# Patient Record
Sex: Female | Born: 1945
Health system: Southern US, Community
[De-identification: ages and names within clinical notes are randomized; demographics above are authoritative.]

## PROBLEM LIST (undated history)

## (undated) DIAGNOSIS — J9600 Acute respiratory failure, unspecified whether with hypoxia or hypercapnia: Secondary | ICD-10-CM

## (undated) DIAGNOSIS — J302 Other seasonal allergic rhinitis: Secondary | ICD-10-CM

## (undated) DIAGNOSIS — I10 Essential (primary) hypertension: Secondary | ICD-10-CM

## (undated) DIAGNOSIS — I509 Heart failure, unspecified: Secondary | ICD-10-CM

## (undated) DIAGNOSIS — G629 Polyneuropathy, unspecified: Secondary | ICD-10-CM

## (undated) DIAGNOSIS — I83893 Varicose veins of bilateral lower extremities with other complications: Secondary | ICD-10-CM

## (undated) DIAGNOSIS — E039 Hypothyroidism, unspecified: Secondary | ICD-10-CM

## (undated) DIAGNOSIS — E079 Disorder of thyroid, unspecified: Secondary | ICD-10-CM

## (undated) HISTORY — DX: Morbid (severe) obesity due to excess calories: E66.01

## (undated) HISTORY — DX: Heart failure, unspecified: I50.9

## (undated) HISTORY — DX: Polyneuropathy, unspecified: G62.9

## (undated) HISTORY — DX: Other seasonal allergic rhinitis: J30.2

## (undated) HISTORY — DX: Acute respiratory failure, unspecified whether with hypoxia or hypercapnia: J96.00

## (undated) HISTORY — DX: Essential (primary) hypertension: I10

## (undated) HISTORY — DX: Hypothyroidism, unspecified: E03.9

## (undated) HISTORY — DX: Varicose veins of bilateral lower extremities with other complications: I83.893

---

## 2016-03-29 ENCOUNTER — Other Ambulatory Visit (HOSPITAL_BASED_OUTPATIENT_CLINIC_OR_DEPARTMENT_OTHER): Payer: Self-pay

## 2016-03-29 DIAGNOSIS — R0683 Snoring: Secondary | ICD-10-CM

## 2016-03-29 DIAGNOSIS — R5383 Other fatigue: Secondary | ICD-10-CM

## 2016-05-26 ENCOUNTER — Ambulatory Visit (HOSPITAL_BASED_OUTPATIENT_CLINIC_OR_DEPARTMENT_OTHER): Payer: Medicare HMO

## 2016-05-28 ENCOUNTER — Encounter (HOSPITAL_BASED_OUTPATIENT_CLINIC_OR_DEPARTMENT_OTHER): Payer: Medicare HMO

## 2016-07-14 ENCOUNTER — Ambulatory Visit (HOSPITAL_BASED_OUTPATIENT_CLINIC_OR_DEPARTMENT_OTHER): Payer: Medicare HMO | Attending: Internal Medicine | Admitting: Internal Medicine

## 2016-07-14 VITALS — Ht 62.0 in | Wt 214.0 lb

## 2016-07-14 DIAGNOSIS — G479 Sleep disorder, unspecified: Secondary | ICD-10-CM | POA: Diagnosis present

## 2016-07-14 DIAGNOSIS — R0683 Snoring: Secondary | ICD-10-CM | POA: Diagnosis not present

## 2016-07-14 DIAGNOSIS — R5383 Other fatigue: Secondary | ICD-10-CM

## 2016-07-14 DIAGNOSIS — Z87898 Personal history of other specified conditions: Secondary | ICD-10-CM

## 2016-07-14 DIAGNOSIS — R400 Somnolence: Secondary | ICD-10-CM | POA: Diagnosis present

## 2016-07-22 DIAGNOSIS — Z87898 Personal history of other specified conditions: Secondary | ICD-10-CM

## 2016-07-22 NOTE — Procedures (Signed)
  Patient Name: Kathleen Long, Kathleen Long: 07/14/2016 Gender: Female D.O.B: 01/17/1946 Age (years): [age] Referring Provider: Amada KingfisherGeorge Osei Bonsu Height (inches): 62 Interpreting Physician: Jetty Duhamellinton Young MD, ABSM Weight (lbs): 214 RPSGT: Cherylann ParrDubili, Fred BMI: 39 MRN: [id number] Neck Size: 15.50 CLINICAL INFORMATION Sleep Study Type: NPSG  Indication for sleep study: Fatigue, Hypertension, Obesity, OSA, Snoring, Witnessed Apneas  Epworth Sleepiness Score: 8  SLEEP STUDY TECHNIQUE As per the AASM Manual for the Scoring of Sleep and Associated Events v2.3 (April 2016) with a hypopnea requiring 4% desaturations.  The channels recorded and monitored were frontal, central and occipital EEG, electrooculogram (EOG), submentalis EMG (chin), nasal and oral airflow, thoracic and abdominal wall motion, anterior tibialis EMG, snore microphone, electrocardiogram, and pulse oximetry.  MEDICATIONS Medications self-administered by patient taken the night of the study : none reported  SLEEP ARCHITECTURE The study was initiated at 10:13:01 PM and ended at 5:00:27 AM.  Sleep onset time was 124.6 minutes and the sleep efficiency was 48.8%. The total sleep time was 198.8 minutes.  Stage REM latency was 166.0 minutes.  The patient spent 14.08% of the night in stage N1 sleep, 68.82% in stage N2 sleep, 0.00% in stage N3 and 17.10% in REM.  Alpha intrusion was absent.  Supine sleep was 27.01%.  RESPIRATORY PARAMETERS The overall apnea/hypopnea index (AHI) was 44.4 per hour. There were 33 total apneas, including 33 obstructive, 0 central and 0 mixed apneas. There were 114 hypopneas and 2 RERAs.  The AHI during Stage REM sleep was 84.7 per hour.  AHI while supine was 55.9 per hour.  The mean oxygen saturation was 88.66%. The minimum SpO2 during sleep was 74.00%.  Loud snoring was noted during this study.  CARDIAC DATA The 2 lead EKG demonstrated sinus rhythm. The mean heart rate was 67.41 beats  per minute. Other EKG findings include: None.  LEG MOVEMENT DATA The total PLMS were 0 with a resulting PLMS index of 0.00. Associated arousal with leg movement index was 0.0 .  IMPRESSIONS - Severe obstructive sleep apnea occurred during this study (AHI = 44.4/h). - Insufficient early events and sleep to meet protocol requirements for split CPAP titration protocol. - No significant central sleep apnea occurred during this study (CAI = 0.0/h). - Moderate oxygen desaturation was noted during this study (Min O2 = 74.00%). - The patient snored with Loud snoring volume. - Difficulty initiating and maintaining sleep- "tossed and turned all night" - No cardiac abnormalities were noted during this study. - Clinically significant periodic limb movements did not occur during sleep. No significant associated arousals.  DIAGNOSIS - Obstructive Sleep Apnea (327.23 [G47.33 ICD-10]) - Nocturnal Hypoxemia (327.26 [G47.36 ICD-10]) - Insomnia  RECOMMENDATIONS - Therapeutic CPAP titration to determine optimal pressure required to alleviate sleep disordered breathing. - Positional therapy avoiding supine position during sleep. - Avoid alcohol, sedatives and other CNS depressants that may worsen sleep apnea and disrupt normal sleep architecture. - Sleep hygiene should be reviewed to assess factors that may improve sleep quality. - Weight management and regular exercise should be initiated or continued if appropriate.  [Electronically signed] 07/22/2016 02:03 PM  Jetty Duhamellinton Young MD, ABSM Diplomate, American Board of Sleep Medicine   NPI: [id number]  Waymon BudgeYOUNG,CLINTON D Diplomate, American Board of Sleep Medicine  ELECTRONICALLY SIGNED ON:  07/22/2016, 2:03 PM [facility] PH: [phone]   FX: [phone] ACCREDITED BY THE AMERICAN ACADEMY OF SLEEP MEDICINE

## 2018-01-30 ENCOUNTER — Other Ambulatory Visit: Payer: Self-pay

## 2018-01-30 DIAGNOSIS — I83813 Varicose veins of bilateral lower extremities with pain: Secondary | ICD-10-CM

## 2018-02-06 ENCOUNTER — Encounter: Payer: Medicare HMO | Admitting: Vascular Surgery

## 2018-02-06 ENCOUNTER — Inpatient Hospital Stay (HOSPITAL_COMMUNITY): Admission: RE | Admit: 2018-02-06 | Payer: Medicare HMO | Source: Ambulatory Visit

## 2018-02-06 ENCOUNTER — Encounter

## 2018-04-11 ENCOUNTER — Encounter (HOSPITAL_COMMUNITY): Payer: Medicare HMO

## 2018-04-11 ENCOUNTER — Encounter: Payer: Medicare HMO | Admitting: Vascular Surgery

## 2019-02-26 DIAGNOSIS — Z87891 Personal history of nicotine dependence: Secondary | ICD-10-CM | POA: Diagnosis not present

## 2019-02-26 DIAGNOSIS — E039 Hypothyroidism, unspecified: Secondary | ICD-10-CM | POA: Diagnosis not present

## 2019-02-26 DIAGNOSIS — J302 Other seasonal allergic rhinitis: Secondary | ICD-10-CM | POA: Diagnosis not present

## 2019-02-26 DIAGNOSIS — G629 Polyneuropathy, unspecified: Secondary | ICD-10-CM | POA: Diagnosis not present

## 2019-02-26 DIAGNOSIS — E782 Mixed hyperlipidemia: Secondary | ICD-10-CM | POA: Diagnosis not present

## 2019-02-26 DIAGNOSIS — I119 Hypertensive heart disease without heart failure: Secondary | ICD-10-CM | POA: Diagnosis not present

## 2019-02-26 DIAGNOSIS — I10 Essential (primary) hypertension: Secondary | ICD-10-CM | POA: Diagnosis not present

## 2019-09-01 DIAGNOSIS — Z0100 Encounter for examination of eyes and vision without abnormal findings: Secondary | ICD-10-CM | POA: Diagnosis not present

## 2019-09-01 DIAGNOSIS — H524 Presbyopia: Secondary | ICD-10-CM | POA: Diagnosis not present

## 2019-11-28 DIAGNOSIS — Z87891 Personal history of nicotine dependence: Secondary | ICD-10-CM | POA: Diagnosis not present

## 2019-11-28 DIAGNOSIS — E039 Hypothyroidism, unspecified: Secondary | ICD-10-CM | POA: Diagnosis not present

## 2019-11-28 DIAGNOSIS — I119 Hypertensive heart disease without heart failure: Secondary | ICD-10-CM | POA: Diagnosis not present

## 2019-11-28 DIAGNOSIS — E782 Mixed hyperlipidemia: Secondary | ICD-10-CM | POA: Diagnosis not present

## 2019-11-28 DIAGNOSIS — I10 Essential (primary) hypertension: Secondary | ICD-10-CM | POA: Diagnosis not present

## 2019-11-28 DIAGNOSIS — M25561 Pain in right knee: Secondary | ICD-10-CM | POA: Diagnosis not present

## 2019-11-28 DIAGNOSIS — J302 Other seasonal allergic rhinitis: Secondary | ICD-10-CM | POA: Diagnosis not present

## 2019-11-28 DIAGNOSIS — G629 Polyneuropathy, unspecified: Secondary | ICD-10-CM | POA: Diagnosis not present

## 2020-01-19 ENCOUNTER — Inpatient Hospital Stay (HOSPITAL_COMMUNITY)
Admission: EM | Admit: 2020-01-19 | Discharge: 2020-01-21 | DRG: 177 | Disposition: A | Discharge status: Home / Self Care | Payer: Medicare HMO | Attending: Internal Medicine | Admitting: Internal Medicine

## 2020-01-19 ENCOUNTER — Encounter (HOSPITAL_COMMUNITY): Payer: Self-pay

## 2020-01-19 ENCOUNTER — Other Ambulatory Visit: Payer: Self-pay

## 2020-01-19 ENCOUNTER — Emergency Department (HOSPITAL_COMMUNITY): Payer: Medicare HMO

## 2020-01-19 DIAGNOSIS — Z23 Encounter for immunization: Secondary | ICD-10-CM | POA: Diagnosis not present

## 2020-01-19 DIAGNOSIS — Z79890 Hormone replacement therapy (postmenopausal): Secondary | ICD-10-CM | POA: Diagnosis not present

## 2020-01-19 DIAGNOSIS — Z79899 Other long term (current) drug therapy: Secondary | ICD-10-CM | POA: Diagnosis not present

## 2020-01-19 DIAGNOSIS — U071 COVID-19: Secondary | ICD-10-CM | POA: Diagnosis not present

## 2020-01-19 DIAGNOSIS — E039 Hypothyroidism, unspecified: Secondary | ICD-10-CM | POA: Diagnosis present

## 2020-01-19 DIAGNOSIS — I452 Bifascicular block: Secondary | ICD-10-CM | POA: Diagnosis not present

## 2020-01-19 DIAGNOSIS — J9601 Acute respiratory failure with hypoxia: Secondary | ICD-10-CM | POA: Diagnosis not present

## 2020-01-19 DIAGNOSIS — E079 Disorder of thyroid, unspecified: Secondary | ICD-10-CM | POA: Diagnosis not present

## 2020-01-19 DIAGNOSIS — I509 Heart failure, unspecified: Secondary | ICD-10-CM

## 2020-01-19 DIAGNOSIS — I10 Essential (primary) hypertension: Secondary | ICD-10-CM | POA: Diagnosis present

## 2020-01-19 DIAGNOSIS — I110 Hypertensive heart disease with heart failure: Secondary | ICD-10-CM | POA: Diagnosis not present

## 2020-01-19 DIAGNOSIS — R0602 Shortness of breath: Secondary | ICD-10-CM | POA: Diagnosis not present

## 2020-01-19 DIAGNOSIS — J1282 Pneumonia due to coronavirus disease 2019: Secondary | ICD-10-CM | POA: Diagnosis present

## 2020-01-19 DIAGNOSIS — J9600 Acute respiratory failure, unspecified whether with hypoxia or hypercapnia: Secondary | ICD-10-CM | POA: Diagnosis present

## 2020-01-19 DIAGNOSIS — I517 Cardiomegaly: Secondary | ICD-10-CM | POA: Diagnosis not present

## 2020-01-19 HISTORY — DX: COVID-19: U07.1

## 2020-01-19 HISTORY — DX: Essential (primary) hypertension: I10

## 2020-01-19 HISTORY — DX: Disorder of thyroid, unspecified: E07.9

## 2020-01-19 HISTORY — DX: Heart failure, unspecified: I50.9

## 2020-01-19 LAB — CBC WITH DIFFERENTIAL/PLATELET
Abs Immature Granulocytes: 0.03 10*3/uL (ref 0.00–0.07)
Basophils Absolute: 0 10*3/uL (ref 0.0–0.1)
Basophils Relative: 0 %
Eosinophils Absolute: 0 10*3/uL (ref 0.0–0.5)
Eosinophils Relative: 0 %
HCT: 42.5 % (ref 36.0–46.0)
Hemoglobin: 13.6 g/dL (ref 12.0–15.0)
Immature Granulocytes: 0 %
Lymphocytes Relative: 18 %
Lymphs Abs: 1.7 10*3/uL (ref 0.7–4.0)
MCH: 31.9 pg (ref 26.0–34.0)
MCHC: 32 g/dL (ref 30.0–36.0)
MCV: 99.5 fL (ref 80.0–100.0)
Monocytes Absolute: 1 10*3/uL (ref 0.1–1.0)
Monocytes Relative: 11 %
Neutro Abs: 6.5 10*3/uL (ref 1.7–7.7)
Neutrophils Relative %: 71 %
Platelets: 202 10*3/uL (ref 150–400)
RBC: 4.27 MIL/uL (ref 3.87–5.11)
RDW: 12.6 % (ref 11.5–15.5)
WBC: 9.2 10*3/uL (ref 4.0–10.5)
nRBC: 0 % (ref 0.0–0.2)

## 2020-01-19 LAB — C-REACTIVE PROTEIN: CRP: 8.7 mg/dL — ABNORMAL HIGH (ref <1.0)

## 2020-01-19 LAB — COMPREHENSIVE METABOLIC PANEL
ALT: 21 U/L (ref 0–44)
AST: 30 U/L (ref 15–41)
Albumin: 4 g/dL (ref 3.5–5.0)
Alkaline Phosphatase: 89 U/L (ref 38–126)
Anion gap: 11 (ref 5–15)
BUN: 20 mg/dL (ref 8–23)
CO2: 25 mmol/L (ref 22–32)
Calcium: 8.5 mg/dL — ABNORMAL LOW (ref 8.9–10.3)
Chloride: 101 mmol/L (ref 98–111)
Creatinine, Ser: 1.29 mg/dL — ABNORMAL HIGH (ref 0.44–1.00)
GFR calc Af Amer: 47 mL/min — ABNORMAL LOW (ref >60)
GFR calc non Af Amer: 41 mL/min — ABNORMAL LOW (ref >60)
Glucose, Bld: 124 mg/dL — ABNORMAL HIGH (ref 70–99)
Potassium: 4.3 mmol/L (ref 3.5–5.1)
Sodium: 137 mmol/L (ref 135–145)
Total Bilirubin: 0.8 mg/dL (ref 0.3–1.2)
Total Protein: 7.7 g/dL (ref 6.5–8.1)

## 2020-01-19 LAB — FIBRINOGEN: Fibrinogen: 551 mg/dL — ABNORMAL HIGH (ref 210–475)

## 2020-01-19 LAB — D-DIMER, QUANTITATIVE: D-Dimer, Quant: 1 ug/mL-FEU — ABNORMAL HIGH (ref 0.00–0.50)

## 2020-01-19 LAB — PROCALCITONIN: Procalcitonin: 0.14 ng/mL

## 2020-01-19 LAB — SARS CORONAVIRUS 2 BY RT PCR (HOSPITAL ORDER, PERFORMED IN ~~LOC~~ HOSPITAL LAB): SARS Coronavirus 2: POSITIVE — AB

## 2020-01-19 LAB — FERRITIN: Ferritin: 997 ng/mL — ABNORMAL HIGH (ref 11–307)

## 2020-01-19 LAB — TRIGLYCERIDES: Triglycerides: 104 mg/dL (ref <150)

## 2020-01-19 LAB — LACTIC ACID, PLASMA: Lactic Acid, Venous: 1.1 mmol/L (ref 0.5–1.9)

## 2020-01-19 LAB — LACTATE DEHYDROGENASE: LDH: 181 U/L (ref 98–192)

## 2020-01-19 MED ORDER — FUROSEMIDE 40 MG PO TABS
20.0000 mg | ORAL_TABLET | Freq: Every day | ORAL | Status: DC
  Administered 2020-01-20 – 2020-01-21 (×2): 20 mg via ORAL
  Filled 2020-01-19 (×2): qty 1

## 2020-01-19 MED ORDER — DEXAMETHASONE SODIUM PHOSPHATE 10 MG/ML IJ SOLN
6.0000 mg | Freq: Once | INTRAMUSCULAR | Status: AC
  Administered 2020-01-19: 6 mg via INTRAVENOUS
  Filled 2020-01-19: qty 1

## 2020-01-19 MED ORDER — SODIUM CHLORIDE 0.9 % IV SOLN: 250.0000 mL | INTRAVENOUS | Status: DC | PRN

## 2020-01-19 MED ORDER — SODIUM CHLORIDE 0.9% FLUSH: 3.0000 mL | INTRAVENOUS | Status: DC | PRN

## 2020-01-19 MED ORDER — ACETAMINOPHEN 325 MG PO TABS
650.0000 mg | ORAL_TABLET | Freq: Once | ORAL | Status: AC
  Administered 2020-01-19: 650 mg via ORAL
  Filled 2020-01-19: qty 2

## 2020-01-19 MED ORDER — ENOXAPARIN SODIUM 60 MG/0.6ML ~~LOC~~ SOLN
50.0000 mg | SUBCUTANEOUS | Status: DC
  Administered 2020-01-19 – 2020-01-20 (×2): 50 mg via SUBCUTANEOUS
  Filled 2020-01-19 (×3): qty 0.5

## 2020-01-19 MED ORDER — SODIUM CHLORIDE 0.9 % IV SOLN
100.0000 mg | Freq: Every day | INTRAVENOUS | Status: DC
  Administered 2020-01-20 – 2020-01-21 (×2): 100 mg via INTRAVENOUS
  Filled 2020-01-19 (×2): qty 20

## 2020-01-19 MED ORDER — SODIUM CHLORIDE 0.9 % IV SOLN: 100.0000 mg | Freq: Every day | INTRAVENOUS | Status: DC

## 2020-01-19 MED ORDER — SODIUM CHLORIDE 0.9% FLUSH
3.0000 mL | Freq: Two times a day (BID) | INTRAVENOUS | Status: DC
  Administered 2020-01-20 (×2): 3 mL via INTRAVENOUS

## 2020-01-19 MED ORDER — INFLUENZA VAC A&B SA ADJ QUAD 0.5 ML IM PRSY
0.5000 mL | PREFILLED_SYRINGE | INTRAMUSCULAR | Status: AC
  Administered 2020-01-20: 0.5 mL via INTRAMUSCULAR
  Filled 2020-01-19: qty 0.5

## 2020-01-19 MED ORDER — DEXAMETHASONE SODIUM PHOSPHATE 10 MG/ML IJ SOLN
6.0000 mg | Freq: Every day | INTRAMUSCULAR | Status: DC
  Administered 2020-01-20: 6 mg via INTRAVENOUS
  Filled 2020-01-19: qty 1

## 2020-01-19 MED ORDER — ZINC SULFATE 220 (50 ZN) MG PO CAPS
220.0000 mg | ORAL_CAPSULE | Freq: Every day | ORAL | Status: DC
  Administered 2020-01-19 – 2020-01-21 (×3): 220 mg via ORAL
  Filled 2020-01-19 (×3): qty 1

## 2020-01-19 MED ORDER — SODIUM CHLORIDE 0.9 % IV SOLN: 200.0000 mg | Freq: Once | INTRAVENOUS | Status: DC

## 2020-01-19 MED ORDER — GABAPENTIN 300 MG PO CAPS
300.0000 mg | ORAL_CAPSULE | Freq: Every day | ORAL | Status: DC
  Administered 2020-01-19 – 2020-01-20 (×2): 300 mg via ORAL
  Filled 2020-01-19 (×2): qty 1

## 2020-01-19 MED ORDER — AMLODIPINE BESYLATE 5 MG PO TABS
10.0000 mg | ORAL_TABLET | Freq: Every day | ORAL | Status: DC
  Administered 2020-01-20 – 2020-01-21 (×2): 10 mg via ORAL
  Filled 2020-01-19 (×2): qty 2

## 2020-01-19 MED ORDER — LEVOTHYROXINE SODIUM 25 MCG PO TABS
125.0000 ug | ORAL_TABLET | Freq: Every day | ORAL | Status: DC
  Administered 2020-01-20 – 2020-01-21 (×2): 125 ug via ORAL
  Filled 2020-01-19 (×2): qty 1

## 2020-01-19 MED ORDER — ALBUTEROL SULFATE HFA 108 (90 BASE) MCG/ACT IN AERS
2.0000 | INHALATION_SPRAY | Freq: Four times a day (QID) | RESPIRATORY_TRACT | Status: DC
  Administered 2020-01-19 – 2020-01-21 (×8): 2 via RESPIRATORY_TRACT
  Filled 2020-01-19 (×2): qty 6.7

## 2020-01-19 MED ORDER — ONDANSETRON HCL 4 MG/2ML IJ SOLN: 4.0000 mg | Freq: Four times a day (QID) | INTRAMUSCULAR | Status: DC | PRN

## 2020-01-19 MED ORDER — SODIUM CHLORIDE 0.9 % IV SOLN
200.0000 mg | Freq: Once | INTRAVENOUS | Status: AC
  Administered 2020-01-19: 200 mg via INTRAVENOUS
  Filled 2020-01-19: qty 200

## 2020-01-19 MED ORDER — ONDANSETRON HCL 4 MG PO TABS: 4.0000 mg | ORAL_TABLET | Freq: Four times a day (QID) | ORAL | Status: DC | PRN

## 2020-01-19 MED ORDER — ASCORBIC ACID 500 MG PO TABS
500.0000 mg | ORAL_TABLET | Freq: Every day | ORAL | Status: DC
  Administered 2020-01-19 – 2020-01-21 (×3): 500 mg via ORAL
  Filled 2020-01-19 (×3): qty 1

## 2020-01-19 NOTE — Progress Notes (Signed)
Nursing admission history completed with attempt of 3rd spanish interpreter. Pt states she has medications with her. Instructed her not to take them and told her that her nurse will give her medications to her. Pt verbalizes understanding. Briscoe Burns BSN, RN-BC Admissions RN 01/19/2020 8:46 PM

## 2020-01-19 NOTE — ED Notes (Addendum)
Daughter [employer] given update on care. [phone].

## 2020-01-19 NOTE — H&P (Addendum)
History and Physical    Zanetta Dehaan DZH:[id number] DOB: May 30, 1945 DOA: 01/19/2020  PCP: Jackie Plum, MD   Patient coming from: Home  I have personally briefly reviewed patient's old medical records in [facility]  Chief Complaint: Shortness of breath  HPI: Blue Ruggerio is a 74 y.o. female with medical history significant for hypertension, hypothyroidism and CHF who presents to the ER for evaluation of progressively worsening shortness of breath over the last 6 days associated with diarrhea, fever, chills and myalgias.  Patient is unvaccinated against COVID-19.  She had an exposure to family member who recently tested positive for COVID-19 virus.  She has been taking ibuprofen and amoxicillin as well as Tylenol without any improvement in her symptoms. On room air at rest she had a pulse oximetry of 88% and is improved to 96% following oxygen supplementation at 2 L.  She was febrile with a T-max of 1.5.   Chest x-ray reviewed by me showed multiple ill-defined airspace opacities noted throughout both lungs concerning for multifocal pneumonia Twelve-lead EKG reviewed by me shows sinus rhythm with right bundle branch block and left anterior fascicular block   ED Course: Patient is a 74 year old unvaccinated female who presents to the ER for evaluation of worsening shortness of breath over the last 6 days associated with shortness of breath, diarrhea, fever and myalgias.  She tested positive for the COVID-19 virus and imaging is suggestive of viral pneumonia.  Patient was hypoxic on room air and had room air pulse oximetry of 86%.  She received a dose of remdesivir and Decadron in the ER and will be admitted to the hospital for further evaluation and treatment  Review of Systems: As per HPI otherwise 10 point review of systems negative.    Past Medical History:  Diagnosis Date  . CHF (congestive heart failure) (HCC)   . Hypertension   . Thyroid disease     History  reviewed. No pertinent surgical history.   reports that she has never smoked. She has never used smokeless tobacco. She reports that she does not drink alcohol and does not use drugs.  No Known Allergies  History reviewed. No pertinent family history.   Prior to Admission medications   Medication Sig Start Date End Date Taking? Authorizing Provider  Cholecalciferol (VITAMIN D3 PO) Take 1 tablet by mouth daily.   Yes [provider]  ibuprofen (ADVIL) 200 MG tablet Take 400 mg by mouth 2 (two) times daily as needed for fever.   Yes [provider]  amLODipine (NORVASC) 10 MG tablet Take 10 mg by mouth daily. 12/25/19   [provider]  AMOXICILLIN PO Take 1 tablet by mouth 2 (two) times daily.    [provider]  cetirizine (ZYRTEC) 10 MG tablet Take 10 mg by mouth daily. 11/18/19   [provider]  clotrimazole-betamethasone (LOTRISONE) cream Apply 1 application topically 2 (two) times daily as needed. 12/16/19   [provider]  furosemide (LASIX) 20 MG tablet Take 20 mg by mouth daily. 11/04/19   [provider]  gabapentin (NEURONTIN) 300 MG capsule Take 300 mg by mouth at bedtime. 01/08/20   [provider]  levothyroxine (SYNTHROID) 125 MCG tablet Take 125 mcg by mouth daily. 11/04/19   [provider]  meloxicam (MOBIC) 7.5 MG tablet Take 7.5 mg by mouth daily. 12/16/19   [provider]    Physical Exam: Vitals:   01/19/20 1240 01/19/20 1313 01/19/20 1515 01/19/20 1545  BP: 111/63  99/65 121/85  Pulse: 96  72 71  Resp: (!) 24  (!) 22 20  Temp: (!) 101.5 F (38.6 C)   98.4 F (36.9 C)  TempSrc: Oral   Oral  SpO2: (!) 89%  94% 98%  Weight:  97.1 kg    Height:  5\' 2"  (1.575 m)       Vitals:   01/19/20 1240 01/19/20 1313 01/19/20 1515 01/19/20 1545  BP: 111/63  99/65 121/85  Pulse: 96  72 71  Resp: (!) 24  (!) 22 20  Temp: (!) 101.5 F (38.6 C)   98.4 F (36.9 C)  TempSrc: Oral   Oral   SpO2: (!) 89%  94% 98%  Weight:  97.1 kg    Height:  5\' 2"  (1.575 m)      Constitutional: NAD, alert and oriented x 3. Appears comfortable and in no distress Eyes: PERRL, lids and conjunctivae normal ENMT: Mucous membranes are moist.  Neck: normal, supple, no masses, no thyromegaly Respiratory: Scattered rhonchi, no wheezing, no crackles. Normal respiratory effort. No accessory muscle use.  Cardiovascular: Regular rate and rhythm, no murmurs / rubs / gallops. No extremity edema. 2+ pedal pulses. No carotid bruits.  Abdomen: no tenderness, no masses palpated. No hepatosplenomegaly. Bowel sounds positive.  Musculoskeletal: no clubbing / cyanosis. No joint deformity upper and lower extremities.  Skin: no rashes, lesions, ulcers.  Neurologic: No gross focal neurologic deficit. Psychiatric: Normal mood and affect.   Labs on Admission: I have personally reviewed following labs and imaging studies  CBC: Recent Labs  Lab 01/19/20 1330  WBC 9.2  NEUTROABS 6.5  HGB 13.6  HCT 42.5  MCV 99.5  PLT 202   Basic Metabolic Panel: Recent Labs  Lab 01/19/20 1330  NA 137  K 4.3  CL 101  CO2 25  GLUCOSE 124*  BUN 20  CREATININE 1.29*  CALCIUM 8.5*   GFR: Estimated Creatinine Clearance: 41.6 mL/min (A) (by C-G formula based on SCr of 1.29 mg/dL (H)). Liver Function Tests: Recent Labs  Lab 01/19/20 1330  AST 30  ALT 21  ALKPHOS 89  BILITOT 0.8  PROT 7.7  ALBUMIN 4.0   No results for input(s): LIPASE, AMYLASE in the last 168 hours. No results for input(s): AMMONIA in the last 168 hours. Coagulation Profile: No results for input(s): INR, PROTIME in the last 168 hours. Cardiac Enzymes: No results for input(s): CKTOTAL, CKMB, CKMBINDEX, TROPONINI in the last 168 hours. BNP (last 3 results) No results for input(s): PROBNP in the last 8760 hours. HbA1C: No results for input(s): HGBA1C in the last 72 hours. CBG: No results for input(s): GLUCAP in the last 168 hours. Lipid  Profile: No results for input(s): CHOL, HDL, LDLCALC, TRIG, CHOLHDL, LDLDIRECT in the last 72 hours. Thyroid Function Tests: No results for input(s): TSH, T4TOTAL, FREET4, T3FREE, THYROIDAB in the last 72 hours. Anemia Panel: Recent Labs    01/19/20 1330  FERRITIN 997*   Urine analysis: No results found for: COLORURINE, APPEARANCEUR, LABSPEC, PHURINE, GLUCOSEU, HGBUR, BILIRUBINUR, KETONESUR, PROTEINUR, UROBILINOGEN, NITRITE, LEUKOCYTESUR  Radiological Exams on Admission: DG Chest Port 1 View  Result Date: 01/19/2020 CLINICAL DATA:  Cough, shortness of breath. EXAM: PORTABLE CHEST 1 VIEW COMPARISON:  None. FINDINGS: Mild cardiomegaly is noted. No pneumothorax or pleural effusion is noted. Multiple ill-defined airspace opacities are noted throughout both lungs concerning for multifocal pneumonia such as COVID-19. The visualized skeletal structures are unremarkable. IMPRESSION: Multiple ill-defined airspace opacities are noted throughout both lungs concerning for multifocal  pneumonia such as COVID-19. Electronically Signed   By: Lupita Raider M.D.   On: 01/19/2020 13:54    EKG: Independently reviewed.  Sinus rhythm with incomplete right bundle branch block and anterior fascicular block.  Assessment/Plan Active Problems:   Pneumonia due to COVID-19 virus   CHF (congestive heart failure) (HCC)   Thyroid disease   Hypertension   Acute respiratory failure due to COVID-19 [facility])    Pneumonia due to COVID-19 virus with acute respiratory Failure  Patient presents to the ER for evaluation of fever, chills, myalgias and shortness of breath over the last 6 days She was exposed to someone who had a COVID-19 viral infection Patient's COVID-19 PCR test is positive chest x-ray is consistent with viral pneumonia She had room air pulse oximetry of 88% associated with shortness of breath and this improved following oxygen supplementation at 2L We will place patient on remdesivir per protocol and IV  Decadron Continue oxygen supplementation to maintain pulse oximetry greater than 92% Supportive care with bronchodilators treatments as needed as well as antitussives   Hypertension Continue amlodipine   Hypothyroidism Continue Synthroid   History of CHF Unclear etiology Continue low-dose diuretic Optimize blood pressure control     DVT prophylaxis: Lovenox Code Status: Full code Family Communication: Greater than 50% of time spent discussing plan patient at bedside.  She verbalized agrees with the plan. Disposition Plan: Back to previous home environment Consults called: None    Auriella Wieand MD [facility]     01/19/2020, 5:22 PM

## 2020-01-19 NOTE — ED Provider Notes (Signed)
[facility]-EMERGENCY DEPT Provider Note   CSN: [id number] Arrival date & time: 01/19/20  1157     History Chief Complaint  Patient presents with  . Shortness of Breath    Kathleen Long is a 74 y.o. female who presents to the ED today with complaint of gradual onset, constant, worsening, SOB x 6 days. Pt also complains of diarrhea, subjective fevers, chills, and body aches. She is unvaccinated against COVID 19. She reports she went to the beach last weekend prior to feeling ill and was around a family member who recently tested positive. She has been taking Ibuprofen and Amoxicillin at home without relief. Pt bought the Amoxicillin at a hispanic market. She has not been taking any Tylenol and has not been checking her temperatures at home. She reports today was the worse day of her symptoms prompting her to come in. Pt denies abdominal pain, vomiting, chest pain, or any other associated symptoms.   The history is provided by the patient and medical records.       Past Medical History:  Diagnosis Date  . CHF (congestive heart failure) (HCC)   . Hypertension   . Thyroid disease     There are no problems to display for this patient.   OB History   No obstetric history on file.     History reviewed. No pertinent family history.  Social History   Tobacco Use  . Smoking status: Never Smoker  . Smokeless tobacco: Never Used  Substance Use Topics  . Alcohol use: Never  . Drug use: Never    Home Medications Prior to Admission medications   Not on File    Allergies    Patient has no known allergies.  Review of Systems   Review of Systems  Constitutional: Positive for chills, fatigue and fever.  Respiratory: Positive for cough and shortness of breath.   Cardiovascular: Negative for chest pain.  Gastrointestinal: Positive for diarrhea. Negative for abdominal pain, nausea and vomiting.  Musculoskeletal: Positive for myalgias.  All other systems  reviewed and are negative.   Physical Exam Updated Vital Signs BP 111/63 (BP Location: Right Arm)   Pulse 96   Temp (!) 101.5 F (38.6 C) (Oral) Comment: Made nnurse aware of temp  Resp (!) 24   SpO2 (!) 89%   Physical Exam Vitals and nursing note reviewed.  Constitutional:      Appearance: She is obese. She is not ill-appearing or diaphoretic.  HENT:     Head: Normocephalic and atraumatic.  Eyes:     Conjunctiva/sclera: Conjunctivae normal.  Cardiovascular:     Rate and Rhythm: Normal rate and regular rhythm.  Pulmonary:     Effort: Tachypnea present.     Breath sounds: Decreased breath sounds present. No wheezing, rhonchi or rales.     Comments: Satting 88% on RA at rest. Placed on 2L with increase to 96%. Decreased breath sounds throughout. Pt able to speak in short sentences prior to O2 being placed however improves after O2.  Abdominal:     Palpations: Abdomen is soft.     Tenderness: There is no abdominal tenderness.  Musculoskeletal:     Cervical back: Neck supple.     Right lower leg: No edema.     Left lower leg: No edema.  Skin:    General: Skin is warm and dry.  Neurological:     Mental Status: She is alert.     ED Results / Procedures / Treatments   Labs (  all labs ordered are listed, but only abnormal results are displayed) Labs Reviewed  COMPREHENSIVE METABOLIC PANEL - Abnormal; Notable for the following components:      Result Value   Glucose, Bld 124 (*)    Creatinine, Ser 1.29 (*)    Calcium 8.5 (*)    GFR calc non Af Amer 41 (*)    GFR calc Af Amer 47 (*)    All other components within normal limits  D-DIMER, QUANTITATIVE (NOT AT [facility]) - Abnormal; Notable for the following components:   D-Dimer, Quant 1.00 (*)    All other components within normal limits  FERRITIN - Abnormal; Notable for the following components:   Ferritin 997 (*)    All other components within normal limits  FIBRINOGEN - Abnormal; Notable for the following components:    Fibrinogen 551 (*)    All other components within normal limits  C-REACTIVE PROTEIN - Abnormal; Notable for the following components:   CRP 8.7 (*)    All other components within normal limits  SARS CORONAVIRUS 2 BY RT PCR (HOSPITAL ORDER, PERFORMED IN [facility])  CULTURE, BLOOD (ROUTINE X 2)  CULTURE, BLOOD (ROUTINE X 2)  LACTIC ACID, PLASMA  CBC WITH DIFFERENTIAL/PLATELET  PROCALCITONIN  LACTATE DEHYDROGENASE  TRIGLYCERIDES    EKG None  Radiology DG Chest Port 1 View  Result Date: 01/19/2020 CLINICAL DATA:  Cough, shortness of breath. EXAM: PORTABLE CHEST 1 VIEW COMPARISON:  None. FINDINGS: Mild cardiomegaly is noted. No pneumothorax or pleural effusion is noted. Multiple ill-defined airspace opacities are noted throughout both lungs concerning for multifocal pneumonia such as COVID-19. The visualized skeletal structures are unremarkable. IMPRESSION: Multiple ill-defined airspace opacities are noted throughout both lungs concerning for multifocal pneumonia such as COVID-19. Electronically Signed   By: Lupita Raider M.D.   On: 01/19/2020 13:54    Procedures Procedures (including critical care time)  Medications Ordered in ED Medications  acetaminophen (TYLENOL) tablet 650 mg (650 mg Oral Given 01/19/20 1335)    ED Course  I have reviewed the triage vital signs and the nursing notes.  Pertinent labs & imaging results that were available during my care of the patient were reviewed by me and considered in my medical decision making (see chart for details).    MDM Rules/Calculators/A&P                          74 year old Spanish-speaking female who presents to the ED today with complaint of subjective fevers, chills, cough, shortness of breath, body aches, diarrhea for the past 6 days.  She is unvaccinated.  She was exposed to a family member who recently tested positive.  Reports her symptoms became severe today prompting her to come to the ED.  On arrival  patient was taken to triage and vitals were taken, patient febrile at 101.5 and hypoxic at 89% on room air.  She was brought back to her room at that time.  She was not hooked up to oxygen, oxygen dropped to about 88% before being placed on 2 L, improvement to 96%.  Patient was speaking in short sentences and tachypneic prior to O2 being placed however this improved afterwards.  She does have decreased breath sounds throughout.  Suspect COVID-19 infection today.  She will need to be admitted for hypoxia.  Will order Covid preadmission order set.  Will obtain chest x-ray.  Will provide Tylenol for fever.   X-ray concerning for multifocal pneumonia consistent  with likely COVID-19 diagnosis, still waiting on Covid test to come back.  CBC without leukocytosis.  Hemoglobin stable at 13.6. CMP with glucose 124, creatinine 1.29.  We do not have any old records on patient, lab work from 2013 did show a creatinine 1.09.  Suspect this is around patient's baseline.  Will hold off on fluids at this time given COVID-19 pneumonia on chest x-ray and patient feeling short of breath. Lactic acid within normal limits at 1.1.  Fibrinogen, ferritin, D-dimer, CRP have all returned elevated.  Again still waiting on Covid test however we will plan to admit once returns.  Will order remdesivir and Decadron once it returns.   At shift change case signed out to [facility], PA-C, who will follow up on COVID test and admit for COVID/hypoxia with new oxygen requirement of 2L.   This note was prepared using Dragon voice recognition software and may include unintentional dictation errors due to the inherent limitations of voice recognition software.  Khloi Rawl was evaluated in Emergency Department on 01/19/2020 for the symptoms described in the history of present illness. She was evaluated in the context of the global COVID-19 pandemic, which necessitated consideration that the patient might be at risk for infection  with the SARS-CoV-2 virus that causes COVID-19. Institutional protocols and algorithms that pertain to the evaluation of patients at risk for COVID-19 are in a state of rapid change based on information released by regulatory bodies including the CDC and federal and state organizations. These policies and algorithms were followed during the patient's care in the ED.   Final Clinical Impression(s) / ED Diagnoses Final diagnoses:  None    Rx / DC Orders ED Discharge Orders    None       Tanda Rockers, PA-C 01/19/20 1516    Benjiman Core, MD 01/20/20 [phone]

## 2020-01-19 NOTE — ED Provider Notes (Signed)
15:30: Assumed care of patient in signout pending Covid testing at admission.  Please see prior provider note for full H&P. Briefly patient is a 74 year old female with a history of CHF and hypertension who presented to the emergency department with 6 days of shortness of breath.  She is unvaccinated against COVID-19.    Physical Exam  BP 121/85    Pulse 71    Temp 98.4 F (36.9 C) (Oral)    Resp 20    Ht 5\' 2"  (1.575 m)    Wt 97.1 kg    SpO2 98%    BMI 39.14 kg/m   Physical Exam Vitals and nursing note reviewed.  Constitutional:      Appearance: She is well-developed. She is not toxic-appearing.  HENT:     Head: Normocephalic and atraumatic.  Eyes:     General:        Right eye: No discharge.        Left eye: No discharge.     Conjunctiva/sclera: Conjunctivae normal.  Pulmonary:     Comments: NO significant tachypnea on 2L via [place]. Does not appear in respiratory distress Neurological:     Mental Status: She is alert.     Comments: Clear speech.   Psychiatric:        Behavior: Behavior normal.    ED Course/Procedures     Results for orders placed or performed during the hospital encounter of 01/19/20  SARS Coronavirus 2 by RT PCR (hospital order, performed in [facility] hospital lab) Nasopharyngeal Nasopharyngeal Swab   Specimen: Nasopharyngeal Swab  Result Value Ref Range   SARS Coronavirus 2 POSITIVE (A) NEGATIVE  Lactic acid, plasma  Result Value Ref Range   Lactic Acid, Venous 1.1 0.5 - 1.9 mmol/L  CBC WITH DIFFERENTIAL  Result Value Ref Range   WBC 9.2 4.0 - 10.5 K/uL   RBC 4.27 3.87 - 5.11 MIL/uL   Hemoglobin 13.6 12.0 - 15.0 g/dL   HCT [facility] 36 - 46 %   MCV 99.5 80.0 - 100.0 fL   MCH 31.9 26.0 - 34.0 pg   MCHC 32.0 30.0 - 36.0 g/dL   RDW 27.0 35.0 - 09.3 %   Platelets 202 150 - 400 K/uL   nRBC 0.0 0.0 - 0.2 %   Neutrophils Relative % 71 %   Neutro Abs 6.5 1.7 - 7.7 K/uL   Lymphocytes Relative 18 %   Lymphs Abs 1.7 0.7 - 4.0 K/uL   Monocytes Relative 11 %    Monocytes Absolute 1.0 0 - 1 K/uL   Eosinophils Relative 0 %   Eosinophils Absolute 0.0 0 - 0 K/uL   Basophils Relative 0 %   Basophils Absolute 0.0 0 - 0 K/uL   Immature Granulocytes 0 %   Abs Immature Granulocytes 0.03 0.00 - 0.07 K/uL  Comprehensive metabolic panel  Result Value Ref Range   Sodium 137 135 - 145 mmol/L   Potassium 4.3 3.5 - 5.1 mmol/L   Chloride 101 98 - 111 mmol/L   CO2 25 22 - 32 mmol/L   Glucose, Bld 124 (H) 70 - 99 mg/dL   BUN 20 8 - 23 mg/dL   Creatinine, Ser 81.8 (H) 0.44 - 1.00 mg/dL   Calcium 8.5 (L) 8.9 - 10.3 mg/dL   Total Protein 7.7 6.5 - 8.1 g/dL   Albumin 4.0 3.5 - 5.0 g/dL   AST 30 15 - 41 U/L   ALT 21 0 - 44 U/L   Alkaline Phosphatase 89 38 -  126 U/L   Total Bilirubin 0.8 0.3 - 1.2 mg/dL   GFR calc non Af Amer 41 (L) >60 mL/min   GFR calc Af Amer 47 (L) >60 mL/min   Anion gap 11 5 - 15  D-dimer, quantitative  Result Value Ref Range   D-Dimer, Quant 1.00 (H) 0.00 - 0.50 ug/mL-FEU  Procalcitonin  Result Value Ref Range   Procalcitonin 0.14 ng/mL  Lactate dehydrogenase  Result Value Ref Range   LDH 181 98 - 192 U/L  Ferritin  Result Value Ref Range   Ferritin 997 (H) 11 - 307 ng/mL  Fibrinogen  Result Value Ref Range   Fibrinogen 551 (H) 210 - 475 mg/dL  C-reactive protein  Result Value Ref Range   CRP 8.7 (H) <1.0 mg/dL   DG Chest Port 1 View  Result Date: 01/19/2020 CLINICAL DATA:  Cough, shortness of breath. EXAM: PORTABLE CHEST 1 VIEW COMPARISON:  None. FINDINGS: Mild cardiomegaly is noted. No pneumothorax or pleural effusion is noted. Multiple ill-defined airspace opacities are noted throughout both lungs concerning for multifocal pneumonia such as COVID-19. The visualized skeletal structures are unremarkable. IMPRESSION: Multiple ill-defined airspace opacities are noted throughout both lungs concerning for multifocal pneumonia such as COVID-19. Electronically Signed   By: Lupita Raider M.D.   On: 01/19/2020 13:54      .Critical Care Performed by: Cherly Anderson, PA-C Authorized by: Cherly Anderson, PA-C      CRITICAL CARE Performed by: Harvie Heck   Total critical care time: 30 minutes  Critical care time was exclusive of separately billable procedures and treating other patients.  Critical care was necessary to treat or prevent imminent or life-threatening deterioration.  Critical care was time spent personally by me on the following activities: development of treatment plan with patient and/or surrogate as well as nursing, discussions with consultants, evaluation of patient's response to treatment, examination of patient, obtaining history from patient or surrogate, ordering and performing treatments and interventions, ordering and review of laboratory studies, ordering and review of radiographic studies, pulse oximetry and re-evaluation of patient's condition.  MDM   Patient is Covid positive with several elevated inflammatory markers, chest x-ray with multifocal pneumonia, currently requiring 2 L via nasal cannula for acute hypoxic respiratory failure.  Will consult hospitalist service for admission, start Decadron and remdesivir.  16: 37: CONSULT: Discussed w/ hospitalist Dr. Joylene Igo- accepts admission.   Jenisha Faison was evaluated in Emergency Department on 01/19/2020 for the symptoms described in the history of present illness. He/she was evaluated in the context of the global COVID-19 pandemic, which necessitated consideration that the patient might be at risk for infection with the SARS-CoV-2 virus that causes COVID-19. Institutional protocols and algorithms that pertain to the evaluation of patients at risk for COVID-19 are in a state of rapid change based on information released by regulatory bodies including the CDC and federal and state organizations. These policies and algorithms were followed during the patient's care in the ED.       Desmond Lope 01/19/20 1817    Geoffery Lyons, MD 01/19/20 2133

## 2020-01-19 NOTE — ED Triage Notes (Signed)
Pt came from home for SOB, Fever, diarrhea, and COVID exposure x 6 days ago.

## 2020-01-19 NOTE — Progress Notes (Signed)
Interpreter attempted twice to connect with pt to obtain nursing admission history. Pt will not answer questions at this time. Briscoe Burns BSN, RN-BC Admissions RN 01/19/2020 5:04 PM

## 2020-01-19 NOTE — Progress Notes (Signed)
Flutter valve not provided to pt at this time due to back order on product. RT management aware.

## 2020-01-19 NOTE — Progress Notes (Signed)
Another interpreter attempted but could not get the pt. Unable to complete nursing admission history at current time. Briscoe Burns BSN, RN-BC Admissions RN 01/19/2020 5:37 PM

## 2020-01-20 DIAGNOSIS — J1282 Pneumonia due to coronavirus disease 2019: Secondary | ICD-10-CM

## 2020-01-20 DIAGNOSIS — E079 Disorder of thyroid, unspecified: Secondary | ICD-10-CM

## 2020-01-20 DIAGNOSIS — U071 COVID-19: Principal | ICD-10-CM

## 2020-01-20 DIAGNOSIS — J9600 Acute respiratory failure, unspecified whether with hypoxia or hypercapnia: Secondary | ICD-10-CM

## 2020-01-20 LAB — PHOSPHORUS: Phosphorus: 4.1 mg/dL (ref 2.5–4.6)

## 2020-01-20 LAB — COMPREHENSIVE METABOLIC PANEL
ALT: 22 U/L (ref 0–44)
AST: 27 U/L (ref 15–41)
Albumin: 3.6 g/dL (ref 3.5–5.0)
Alkaline Phosphatase: 80 U/L (ref 38–126)
Anion gap: 13 (ref 5–15)
BUN: 23 mg/dL (ref 8–23)
CO2: 22 mmol/L (ref 22–32)
Calcium: 8.8 mg/dL — ABNORMAL LOW (ref 8.9–10.3)
Chloride: 102 mmol/L (ref 98–111)
Creatinine, Ser: 0.87 mg/dL (ref 0.44–1.00)
GFR calc Af Amer: >60 mL/min (ref >60)
GFR calc non Af Amer: >60 mL/min (ref >60)
Glucose, Bld: 158 mg/dL — ABNORMAL HIGH (ref 70–99)
Potassium: 4.4 mmol/L (ref 3.5–5.1)
Sodium: 137 mmol/L (ref 135–145)
Total Bilirubin: 0.6 mg/dL (ref 0.3–1.2)
Total Protein: 7.4 g/dL (ref 6.5–8.1)

## 2020-01-20 LAB — CBC WITH DIFFERENTIAL/PLATELET
Abs Immature Granulocytes: 0.03 10*3/uL (ref 0.00–0.07)
Basophils Absolute: 0 10*3/uL (ref 0.0–0.1)
Basophils Relative: 0 %
Eosinophils Absolute: 0 10*3/uL (ref 0.0–0.5)
Eosinophils Relative: 0 %
HCT: 40.5 % (ref 36.0–46.0)
Hemoglobin: 13.3 g/dL (ref 12.0–15.0)
Immature Granulocytes: 1 %
Lymphocytes Relative: 27 %
Lymphs Abs: 1.1 10*3/uL (ref 0.7–4.0)
MCH: 32.2 pg (ref 26.0–34.0)
MCHC: 32.8 g/dL (ref 30.0–36.0)
MCV: 98.1 fL (ref 80.0–100.0)
Monocytes Absolute: 0.2 10*3/uL (ref 0.1–1.0)
Monocytes Relative: 6 %
Neutro Abs: 2.8 10*3/uL (ref 1.7–7.7)
Neutrophils Relative %: 66 %
Platelets: 188 10*3/uL (ref 150–400)
RBC: 4.13 MIL/uL (ref 3.87–5.11)
RDW: 12.5 % (ref 11.5–15.5)
WBC: 4.2 10*3/uL (ref 4.0–10.5)
nRBC: 0 % (ref 0.0–0.2)

## 2020-01-20 LAB — D-DIMER, QUANTITATIVE: D-Dimer, Quant: 0.85 ug/mL-FEU — ABNORMAL HIGH (ref 0.00–0.50)

## 2020-01-20 LAB — C-REACTIVE PROTEIN: CRP: 8.6 mg/dL — ABNORMAL HIGH (ref <1.0)

## 2020-01-20 LAB — MAGNESIUM: Magnesium: 2.4 mg/dL (ref 1.7–2.4)

## 2020-01-20 LAB — FERRITIN: Ferritin: 989 ng/mL — ABNORMAL HIGH (ref 11–307)

## 2020-01-20 MED ORDER — METHYLPREDNISOLONE SODIUM SUCC 40 MG IJ SOLR
40.0000 mg | Freq: Two times a day (BID) | INTRAMUSCULAR | Status: DC
  Administered 2020-01-20 – 2020-01-21 (×2): 40 mg via INTRAVENOUS
  Filled 2020-01-20 (×2): qty 1

## 2020-01-20 NOTE — ED Notes (Signed)
Patient received lunch tray 

## 2020-01-20 NOTE — ED Notes (Addendum)
Breakfast received breakfast tray

## 2020-01-20 NOTE — Progress Notes (Signed)
PROGRESS NOTE    Kathleen Long  OZD:[id number] DOB: [birth year] DOA: 01/19/2020 PCP: Kathleen Plum, MD    Brief Narrative:  Kathleen Long is a 74 year old Spanish-speaking female with past medical history notable for essential hypertension, hypothyroidism, CHF who presented to the ED for progressive shortness of breath associated with diarrhea, fever, chills and myalgias.  Patient reports exposure to family member recently who tested positive for Covid-19 virus.  She is unvaccinated.  She has been taking ibuprofen, amoxicillin, Tylenol without any improvement in her symptoms.  In the ED, patient was noted to be hypoxic with SPO2 of 88% requiring 2 L nasal cannula with improvement to 96%.  Patient with a fever of 101.5.  Chest x-ray with findings consistent with multifocal pneumonia.  TRH consulted for admission for acute hypoxic respiratory failure secondary to Covid-19 viral pneumonia.   Assessment & Plan:   Active Problems:   Pneumonia due to COVID-19 virus   CHF (congestive heart failure) (HCC)   Thyroid disease   Hypertension   Acute respiratory failure due to COVID-19 [facility])   Acute hypoxic respiratory failure secondary to acute Covid-19 viral pneumonia during the ongoing 2020/2021 Covid 19 Pandemic - POA Patient presenting to the ED with progressive shortness of breath, cough, fevers, chills myalgias.  Recent sick contact with Covid-19.  Patient is unvaccinated.  Patient was noted to have SPO2 88% on room air.  Chest x-ray with findings consistent with multifocal pneumonia. --COVID test: + Positive of 01/19/2020 --CRP 8.7>8.6 --ddimer 1.00>0.85 --Remdesivir, plan 5-day course (Day #2/5) --Change dexamethasone to Solumedrol 40mg  IV q12h --prone for 2-3hrs every 12hrs if able --Continue supplemental oxygen, titrate to maintain SPO2 greater than 92% --Continue supportive care with albuterol MDI prn, vitamin C, zinc, Tylenol, antitussives  (benzonatate/Mucinex/Tussionex) --Follow CBC, CMP, D-dimer, ferritin, and CRP daily --Continue airborne/contact isolation precautions for 3 weeks from the day of diagnosis  The treatment plan and use of medications and known side effects were discussed with patient/family. Some of the medications used are based on case reports/anecdotal data.  All other medications being used in the management of COVID-19 based on limited study data.  Complete risks and long-term side effects are unknown, however in the best clinical judgment they seem to be of some benefit.  Patient wanted to proceed with treatment options provided.  Essential hypertension --Continue home amlodipine 10 mg p.o. daily  Hypothyroidism --Continue home levothyroxine 125 mcg p.o. daily  History of CHF, undifferentiated; compensated No previous TTE as noted in EMR. --Continue home furosemide 20 mg p.o. daily --Outpatient follow-up with PCP/cardiology  Weakness, debility, deconditioning: --PT/OT evaluation   DVT prophylaxis: Lovenox Code Status: Full code Family Communication: Updated patient's daughter, Kathleen Long via telephone this afternoon  Disposition Plan:  Status is: Inpatient   Remains inpatient appropriate because:Unsafe d/c plan, IV treatments appropriate due to intensity of illness or inability to take PO and Inpatient level of care appropriate due to severity of illness   Dispo: The patient is from: Home              Anticipated d/c is to: Home              Anticipated d/c date is: 2 days              Patient currently is not medically stable to d/c.   Consultants:   None  Procedures:   None  Antimicrobials:   None   Subjective: Patient seen and examined at bedside, continues in ED holding area.  Assisted  with video interpreter due to language barrier.  Patient continues with shortness of breath even at rest, slightly improved since yesterday.  Continues on 2 L nasal cannula.  No other complaints  or concerns at this time.  Denies headache, no fever/chills/night sweats, no nausea/vomiting, no chest pain, palpitations, no abdominal pain.  No acute events overnight per nursing staff.  Objective: Vitals:   01/20/20 0605 01/20/20 0700 01/20/20 0926 01/20/20 1130  BP: (!) 109/43 (!) 89/66 104/69 (!) 100/54  Pulse: 64 (!) 58 65 73  Resp: 20 (!) 24 20 (!) 21  Temp:   97.6 F (36.4 C) 97.6 F (36.4 C)  TempSrc:   Oral Oral  SpO2: 92% 90% 92% 94%  Weight:      Height:       No intake or output data in the 24 hours ending 01/20/20 1320 Filed Weights   01/19/20 1313  Weight: 97.1 kg    Examination:  General exam: Appears calm and comfortable  Respiratory system: Breath sounds are decreased bilateral bases, otherwise clear without wheezing/crackles, normal respiratory effort without accessory muscle use, on 2 L nasal cannula with SPO2 90% at rest Cardiovascular system: S1 & S2 heard, RRR. No JVD, murmurs, rubs, gallops or clicks. No pedal edema. Gastrointestinal system: Abdomen is nondistended, soft and nontender. No organomegaly or masses felt. Normal bowel sounds heard. Central nervous system: Alert and oriented. No focal neurological deficits. Extremities: Symmetric 5 x 5 power. Skin: No rashes, lesions or ulcers Psychiatry: Judgement and insight appear normal. Mood & affect appropriate.     Data Reviewed: I have personally reviewed following labs and imaging studies  CBC: Recent Labs  Lab 01/19/20 1330 01/20/20 0552  WBC 9.2 4.2  NEUTROABS 6.5 2.8  HGB 13.6 13.3  HCT 42.5 40.5  MCV 99.5 98.1  PLT 202 188   Basic Metabolic Panel: Recent Labs  Lab 01/19/20 1330 01/20/20 0639  NA 137 137  K 4.3 4.4  CL 101 102  CO2 25 22  GLUCOSE 124* 158*  BUN 20 23  CREATININE 1.29* 0.87  CALCIUM 8.5* 8.8*  MG  --  2.4  PHOS  --  4.1   GFR: Estimated Creatinine Clearance: 61.7 mL/min (by C-G formula based on SCr of 0.87 mg/dL). Liver Function Tests: Recent Labs  Lab  01/19/20 1330 01/20/20 0639  AST 30 27  ALT 21 22  ALKPHOS 89 80  BILITOT 0.8 0.6  PROT 7.7 7.4  ALBUMIN 4.0 3.6   No results for input(s): LIPASE, AMYLASE in the last 168 hours. No results for input(s): AMMONIA in the last 168 hours. Coagulation Profile: No results for input(s): INR, PROTIME in the last 168 hours. Cardiac Enzymes: No results for input(s): CKTOTAL, CKMB, CKMBINDEX, TROPONINI in the last 168 hours. BNP (last 3 results) No results for input(s): PROBNP in the last 8760 hours. HbA1C: No results for input(s): HGBA1C in the last 72 hours. CBG: No results for input(s): GLUCAP in the last 168 hours. Lipid Profile: Recent Labs    01/19/20 1330  TRIG 104   Thyroid Function Tests: No results for input(s): TSH, T4TOTAL, FREET4, T3FREE, THYROIDAB in the last 72 hours. Anemia Panel: Recent Labs    01/19/20 1330 01/20/20 0552  FERRITIN 997* 989*   Sepsis Labs: Recent Labs  Lab 01/19/20 1330  PROCALCITON 0.14  LATICACIDVEN 1.1    Recent Results (from the past 240 hour(s))  Blood Culture (routine x 2)     Status: None (Preliminary result)   Collection  Time: 01/19/20  1:06 PM   Specimen: BLOOD  Result Value Ref Range Status   Specimen Description   Final    BLOOD LEFT ANTECUBITAL Performed at [facility], [address], [place], [place] [address]    Special Requests   Final    BOTTLES DRAWN AEROBIC AND ANAEROBIC Blood Culture results may not be optimal due to an inadequate volume of blood received in culture bottles Performed at [facility], [address] [place], [place] [address]    Culture   Final    NO GROWTH < 12 HOURS Performed at [facility], [address] [place], [place] [address]    Report Status PENDING  Incomplete  Blood Culture (routine x 2)     Status: None (Preliminary result)   Collection Time: 01/19/20  1:11 PM   Specimen: BLOOD  Result Value Ref Range Status   Specimen Description    Final    BLOOD RIGHT ANTECUBITAL Performed at [facility], [address], [place], [place] [address]    Special Requests   Final    BOTTLES DRAWN AEROBIC AND ANAEROBIC Blood Culture results may not be optimal due to an excessive volume of blood received in culture bottles Performed at [facility], [address] [place], [place] [address]    Culture   Final    NO GROWTH < 12 HOURS Performed at [facility], [address], [place], [place] [address]    Report Status PENDING  Incomplete  SARS Coronavirus 2 by RT PCR (hospital order, performed in [facility] hospital lab) Nasopharyngeal Nasopharyngeal Swab     Status: Abnormal   Collection Time: 01/19/20  1:35 PM   Specimen: Nasopharyngeal Swab  Result Value Ref Range Status   SARS Coronavirus 2 POSITIVE (A) NEGATIVE Final    Comment: RESULT CALLED TO, READ BACK BY AND VERIFIED WITH: WEST,S. RN @1603  01/19/20 BILLINGSLEY,L (NOTE) SARS-CoV-2 target nucleic acids are DETECTED  SARS-CoV-2 RNA is generally detectable in upper respiratory specimens  during the acute phase of infection.  Positive results are indicative  of the presence of the identified virus, but do not rule out bacterial infection or co-infection with other pathogens not detected by the test.  Clinical correlation with patient history and  other diagnostic information is necessary to determine patient infection status.  The expected result is negative.  Fact Sheet for Patients:   01/21/20   Fact Sheet for Healthcare Providers:   BoilerBrush.com.cy    This test is not yet approved or cleared by the https://pope.com/ FDA and  has been authorized for detection and/or diagnosis of SARS-CoV-2 by FDA under an Emergency Use Authorization (EUA).  This EUA will remain in effect (meaning thi s test can be used) for the duration of  the COVID-19 declaration under Section  564(b)(1) of the Act, 21 U.S.C. section 360-bbb-3(b)(1), unless the authorization is terminated or revoked sooner.  Performed at [facility], [address], [place], [place]          Radiology Studies: [facility] Chest Port 1 View  Result Date: 01/19/2020 CLINICAL DATA:  Cough, shortness of breath. EXAM: PORTABLE CHEST 1 VIEW COMPARISON:  None. FINDINGS: Mild cardiomegaly is noted. No pneumothorax or pleural effusion is noted. Multiple ill-defined airspace opacities are noted throughout both lungs concerning for multifocal pneumonia such as COVID-19. The visualized skeletal structures are unremarkable. IMPRESSION: Multiple ill-defined airspace opacities are noted throughout both  lungs concerning for multifocal pneumonia such as COVID-19. Electronically Signed   By: Lupita RaiderJames  Green Jr M.D.   On: 01/19/2020 13:54        Scheduled Meds: . albuterol  2 puff Inhalation Q6H  . amLODipine  10 mg Oral Daily  . vitamin C  500 mg Oral Daily  . dexamethasone (DECADRON) injection  6 mg Intravenous Daily  . enoxaparin (LOVENOX) injection  50 mg Subcutaneous Q24H  . furosemide  20 mg Oral Daily  . gabapentin  300 mg Oral QHS  . levothyroxine  125 mcg Oral Daily  . sodium chloride flush  3 mL Intravenous Q12H  . zinc sulfate  220 mg Oral Daily   Continuous Infusions: . sodium chloride    . remdesivir 100 mg in NS 100 mL Stopped (01/20/20 1137)     LOS: 1 day    Time spent: 38 minutes spent on chart review, discussion with nursing staff, consultants, updating family and interview/physical exam; more than 50% of that time was spent in counseling and/or coordination of care.    Alvira PhilipsEric J [place], DO [facility] Available via Epic secure chat 7am-7pm After these hours, please refer to coverage provider listed on [email or web address] 01/20/2020, 1:20 PM

## 2020-01-20 NOTE — ED Notes (Signed)
Pt moved on to hospital bed with fresh sheets for comfort

## 2020-01-20 NOTE — ED Notes (Signed)
Hospital bed was ordered for pt comfort as she is still on a stretcher.  Will transfer pt once it arrives

## 2020-01-21 LAB — BLOOD CULTURE ID PANEL (REFLEXED) - BCID2

## 2020-01-21 LAB — COMPREHENSIVE METABOLIC PANEL
ALT: 20 U/L (ref 0–44)
AST: 24 U/L (ref 15–41)
Albumin: 3.6 g/dL (ref 3.5–5.0)
Alkaline Phosphatase: 85 U/L (ref 38–126)
Anion gap: 11 (ref 5–15)
BUN: 27 mg/dL — ABNORMAL HIGH (ref 8–23)
CO2: 25 mmol/L (ref 22–32)
Calcium: 9.2 mg/dL (ref 8.9–10.3)
Chloride: 101 mmol/L (ref 98–111)
Creatinine, Ser: 0.77 mg/dL (ref 0.44–1.00)
GFR calc Af Amer: >60 mL/min (ref >60)
GFR calc non Af Amer: >60 mL/min (ref >60)
Glucose, Bld: 206 mg/dL — ABNORMAL HIGH (ref 70–99)
Potassium: 4.8 mmol/L (ref 3.5–5.1)
Sodium: 137 mmol/L (ref 135–145)
Total Bilirubin: 0.5 mg/dL (ref 0.3–1.2)
Total Protein: 7.2 g/dL (ref 6.5–8.1)

## 2020-01-21 LAB — CBC WITH DIFFERENTIAL/PLATELET
Abs Immature Granulocytes: 0.02 10*3/uL (ref 0.00–0.07)
Basophils Absolute: 0 10*3/uL (ref 0.0–0.1)
Basophils Relative: 0 %
Eosinophils Absolute: 0 10*3/uL (ref 0.0–0.5)
Eosinophils Relative: 0 %
HCT: 39.9 % (ref 36.0–46.0)
Hemoglobin: 13.1 g/dL (ref 12.0–15.0)
Immature Granulocytes: 0 %
Lymphocytes Relative: 17 %
Lymphs Abs: 1.2 10*3/uL (ref 0.7–4.0)
MCH: 32.3 pg (ref 26.0–34.0)
MCHC: 32.8 g/dL (ref 30.0–36.0)
MCV: 98.5 fL (ref 80.0–100.0)
Monocytes Absolute: 0.4 10*3/uL (ref 0.1–1.0)
Monocytes Relative: 5 %
Neutro Abs: 5.8 10*3/uL (ref 1.7–7.7)
Neutrophils Relative %: 78 %
Platelets: 212 10*3/uL (ref 150–400)
RBC: 4.05 MIL/uL (ref 3.87–5.11)
RDW: 12.3 % (ref 11.5–15.5)
WBC: 7.4 10*3/uL (ref 4.0–10.5)
nRBC: 0 % (ref 0.0–0.2)

## 2020-01-21 LAB — FERRITIN: Ferritin: 828 ng/mL — ABNORMAL HIGH (ref 11–307)

## 2020-01-21 LAB — D-DIMER, QUANTITATIVE: D-Dimer, Quant: 0.56 ug/mL-FEU — ABNORMAL HIGH (ref 0.00–0.50)

## 2020-01-21 LAB — PHOSPHORUS: Phosphorus: 2.9 mg/dL (ref 2.5–4.6)

## 2020-01-21 LAB — MAGNESIUM: Magnesium: 2.3 mg/dL (ref 1.7–2.4)

## 2020-01-21 LAB — C-REACTIVE PROTEIN: CRP: 5 mg/dL — ABNORMAL HIGH (ref <1.0)

## 2020-01-21 MED ORDER — DEXAMETHASONE 6 MG PO TABS: 6.0000 mg | ORAL_TABLET | Freq: Every day | ORAL | 0 refills | Status: AC

## 2020-01-21 NOTE — ED Notes (Signed)
Per Lyndel Safe, MD do not send patient upstairs. Patient will be getting discharged from ED.

## 2020-01-21 NOTE — Discharge Summary (Signed)
Physician Discharge Summary  Kathleen Long ZOX:[id number] DOB: 02/12/1946 DOA: 01/19/2020  PCP: Jackie Plumsei-Bonsu, George, MD  Admit date: 01/19/2020 Discharge date: 01/21/2020  Admitted From: Home Disposition: Home  Recommendations for Outpatient Follow-up:  1. Follow up with PCP in 1-2 weeks 2. Follow-up on 9/16 and 9/17 as a scheduled to complete your remdesivir therapy.  Home Health: Not applicable Equipment/Devices: On room air  Discharge Condition: Stable CODE STATUS: Full code Diet recommendation: Low-salt diet  Discharge summary: 74 year old Spanish-speaking, hypertension and hypothyroidism presented with shortness of breath associated with diarrhea fever chills and myalgia.  Unvaccinated against COVID-19.  In the emergency room 88% on room air, chest x-ray with bilateral multifocal pneumonia.  Patient was admitted to hospital with pneumonia secondary to COVID-19 virus with acute hypoxemic respiratory failure: Positive test on 9/13 Treated with remdesivir day 3/5, 2 more days of outpatient infusion therapy scheduled and patient's daughter will transport. Treated with Solu-Medrol, good clinical recovery, dexamethasone for 7 more days. Since patient has come off the oxygen and ambulated without distress or hypoxia she is able to go home today on symptomatic treatment, 10 days of steroid therapy and 5 days of remdesivir therapy.  Other medical issues remained stable.  Discharge Diagnoses:  Active Problems:   Pneumonia due to COVID-19 virus   CHF (congestive heart failure) (HCC)   Thyroid disease   Hypertension   Acute respiratory failure due to COVID-19 [facility](HCC)    Discharge Instructions  Discharge Instructions    Call MD for:  difficulty breathing, headache or visual disturbances   Complete by: As directed    Call MD for:  extreme fatigue   Complete by: As directed    Call MD for:  temperature >100.4   Complete by: As directed    Diet - low sodium heart healthy   Complete  by: As directed    Discharge instructions   Complete by: As directed    Can use over-the-counter cough medications and Tylenol as needed. Total isolation for 3 weeks since your positive test.   Increase activity slowly   Complete by: As directed      Allergies as of 01/21/2020   No Known Allergies     Medication List    STOP taking these medications   AMOXICILLIN PO   meloxicam 7.5 MG tablet Commonly known as: MOBIC     TAKE these medications   amLODipine 10 MG tablet Commonly known as: NORVASC Take 10 mg by mouth daily.   cetirizine 10 MG tablet Commonly known as: ZYRTEC Take 10 mg by mouth daily.   clotrimazole-betamethasone cream Commonly known as: LOTRISONE Apply 1 application topically 2 (two) times daily as needed (neck rash).   dexamethasone 6 MG tablet Commonly known as: Decadron Take 1 tablet (6 mg total) by mouth daily for 7 days.   furosemide 20 MG tablet Commonly known as: LASIX Take 20 mg by mouth daily.   gabapentin 300 MG capsule Commonly known as: NEURONTIN Take 300 mg by mouth at bedtime.   ibuprofen 200 MG tablet Commonly known as: ADVIL Take 400 mg by mouth 2 (two) times daily as needed for fever.   levothyroxine 125 MCG tablet Commonly known as: SYNTHROID Take 125 mcg by mouth daily.   VITAMIN D3 PO Take 1 tablet by mouth daily.       No Known Allergies  Consultations:  None   Procedures/Studies: DG Chest Port 1 View  Result Date: 01/19/2020 CLINICAL DATA:  Cough, shortness of breath. EXAM: PORTABLE CHEST 1  VIEW COMPARISON:  None. FINDINGS: Mild cardiomegaly is noted. No pneumothorax or pleural effusion is noted. Multiple ill-defined airspace opacities are noted throughout both lungs concerning for multifocal pneumonia such as COVID-19. The visualized skeletal structures are unremarkable. IMPRESSION: Multiple ill-defined airspace opacities are noted throughout both lungs concerning for multifocal pneumonia such as COVID-19.  Electronically Signed   By: Lupita Raider M.D.   On: 01/19/2020 13:54   (Echo, Carotid, EGD, Colonoscopy, ERCP)    Subjective: Patient was seen and examined.  Was still in the emergency room waiting for inpatient bed assignment since last 2 days.  Her daughter was on the FaceTime and patient requested to use her for translation.  We discussed in detail.  Patient herself feels very comfortable and denied any complaints. She walked with the nursing staff without oxygen and was comfortable with saturations more than 92%.  She is very eager to go home and recover.   Discharge Exam: Vitals:   01/21/20 1453 01/21/20 1500  BP:  120/72  Pulse: 83 84  Resp: (!) 23 15  Temp:    SpO2: 90% 91%   Vitals:   01/21/20 1405 01/21/20 1406 01/21/20 1453 01/21/20 1500  BP:    120/72  Pulse: 67 70 83 84  Resp: 19 (!) 23 (!) 23 15  Temp:      TempSrc:      SpO2: 93% 95% 90% 91%  Weight:      Height:        General: Pt is alert, awake, not in acute distress Cardiovascular: RRR, S1/S2 +, no rubs, no gallops Respiratory: CTA bilaterally, no wheezing, no rhonchi Abdominal: Soft, NT, ND, bowel sounds + Extremities: no edema, no cyanosis    The results of significant diagnostics from this hospitalization (including imaging, microbiology, ancillary and laboratory) are listed below for reference.     Microbiology: Recent Results (from the past 240 hour(s))  Blood Culture (routine x 2)     Status: Abnormal (Preliminary result)   Collection Time: 01/19/20  1:06 PM   Specimen: BLOOD  Result Value Ref Range Status   Specimen Description   Final    BLOOD LEFT ANTECUBITAL Performed at [facility], [address], [place], [place] [address]    Special Requests   Final    BOTTLES DRAWN AEROBIC AND ANAEROBIC Blood Culture results may not be optimal due to an inadequate volume of blood received in culture bottles Performed at [facility], [address],  [place], [place] [address]    Culture  Setup Time   Final    ANAEROBIC BOTTLE ONLY GRAM POSITIVE COCCI IN CLUSTERS Organism ID to follow CRITICAL RESULT CALLED TO, READ BACK BY AND VERIFIED WITH: L POINDEXTER PHARMD 01/21/20 0214 JDW  Performed at [facility], [address], [place], [place] [address]    Culture STAPHYLOCOCCUS EPIDERMIDIS (A)  Final   Report Status PENDING  Incomplete  Blood Culture ID Panel (Reflexed)     Status: Abnormal   Collection Time: 01/19/20  1:06 PM  Result Value Ref Range Status   Enterococcus faecalis NOT DETECTED NOT DETECTED Final   Enterococcus Faecium NOT DETECTED NOT DETECTED Final   Listeria monocytogenes NOT DETECTED NOT DETECTED Final   Staphylococcus species DETECTED (A) NOT DETECTED Final    Comment: CRITICAL RESULT CALLED TO, READ BACK BY AND VERIFIED WITH: L PONDEXTER PHARMD 01/21/20 0214 JDW    Staphylococcus aureus (BCID) NOT DETECTED NOT DETECTED Final   Staphylococcus  epidermidis DETECTED (A) NOT DETECTED Final    Comment: CRITICAL RESULT CALLED TO, READ BACK BY AND VERIFIED WITH: L POINDEXTER PHARMD 01/21/20 0214 JDW    Staphylococcus lugdunensis NOT DETECTED NOT DETECTED Final   Streptococcus species NOT DETECTED NOT DETECTED Final   Streptococcus agalactiae NOT DETECTED NOT DETECTED Final   Streptococcus pneumoniae NOT DETECTED NOT DETECTED Final   Streptococcus pyogenes NOT DETECTED NOT DETECTED Final   A.calcoaceticus-baumannii NOT DETECTED NOT DETECTED Final   Bacteroides fragilis NOT DETECTED NOT DETECTED Final   Enterobacterales NOT DETECTED NOT DETECTED Final   Enterobacter cloacae complex NOT DETECTED NOT DETECTED Final   Escherichia coli NOT DETECTED NOT DETECTED Final   Klebsiella aerogenes NOT DETECTED NOT DETECTED Final   Klebsiella oxytoca NOT DETECTED NOT DETECTED Final   Klebsiella pneumoniae NOT DETECTED NOT DETECTED Final   Proteus species NOT DETECTED NOT DETECTED Final   Salmonella species NOT DETECTED NOT  DETECTED Final   Serratia marcescens NOT DETECTED NOT DETECTED Final   Haemophilus influenzae NOT DETECTED NOT DETECTED Final   Neisseria meningitidis NOT DETECTED NOT DETECTED Final   Pseudomonas aeruginosa NOT DETECTED NOT DETECTED Final   Stenotrophomonas maltophilia NOT DETECTED NOT DETECTED Final   Candida albicans NOT DETECTED NOT DETECTED Final   Candida auris NOT DETECTED NOT DETECTED Final   Candida glabrata NOT DETECTED NOT DETECTED Final   Candida krusei NOT DETECTED NOT DETECTED Final   Candida parapsilosis NOT DETECTED NOT DETECTED Final   Candida tropicalis NOT DETECTED NOT DETECTED Final   Cryptococcus neoformans/gattii NOT DETECTED NOT DETECTED Final   Methicillin resistance mecA/C NOT DETECTED NOT DETECTED Final    Comment: Performed at [facility], [address] [place], [place] [address]  Blood Culture (routine x 2)     Status: None (Preliminary result)   Collection Time: 01/19/20  1:11 PM   Specimen: BLOOD  Result Value Ref Range Status   Specimen Description   Final    BLOOD RIGHT ANTECUBITAL Performed at [facility], [address], [place], [place] [address]    Special Requests   Final    BOTTLES DRAWN AEROBIC AND ANAEROBIC Blood Culture results may not be optimal due to an excessive volume of blood received in culture bottles Performed at [facility], [address], [place], [place] [address]    Culture   Final    NO GROWTH 2 DAYS Performed at [facility], [address], [place], [place] [address]    Report Status PENDING  Incomplete  SARS Coronavirus 2 by RT PCR (hospital order, performed in [facility] hospital lab) Nasopharyngeal Nasopharyngeal Swab     Status: Abnormal   Collection Time: 01/19/20  1:35 PM   Specimen: Nasopharyngeal Swab  Result Value Ref Range Status   SARS Coronavirus 2 POSITIVE (A) NEGATIVE Final    Comment: RESULT CALLED TO, READ BACK BY AND VERIFIED WITH: WEST,S. RN @1603   01/19/20 BILLINGSLEY,L (NOTE) SARS-CoV-2 target nucleic acids are DETECTED  SARS-CoV-2 RNA is generally detectable in upper respiratory specimens  during the acute phase of infection.  Positive results are indicative  of the presence of the identified virus, but do not rule out bacterial infection or co-infection with other pathogens not detected by the test.  Clinical correlation with patient history and  other diagnostic information is necessary to determine patient infection status.  The expected result is negative.  Fact Sheet for Patients:   01/21/20   Fact Sheet for Healthcare  Providers:   https://pope.com/    This test is not yet approved or cleared by the [place] and  has been authorized for detection and/or diagnosis of SARS-CoV-2 by FDA under an Emergency Use Authorization (EUA).  This EUA will remain in effect (meaning thi s test can be used) for the duration of  the COVID-19 declaration under Section 564(b)(1) of the Act, 21 U.S.C. section 360-bbb-3(b)(1), unless the authorization is terminated or revoked sooner.  Performed at [facility], [address], [place], [place] [address]      Labs: BNP (last 3 results) No results for input(s): BNP in the last 8760 hours. Basic Metabolic Panel: Recent Labs  Lab 01/19/20 1330 01/20/20 0639 01/21/20 0500  NA 137 137 137  K 4.3 4.4 4.8  CL 101 102 101  CO2 GLUCOSE 124* 158* 206*  BUN 20 23 27*  CREATININE 1.29* 0.87 0.77  CALCIUM 8.5* 8.8* 9.2  MG  --  2.4 2.3  PHOS  --  4.1 2.9   Liver Function Tests: Recent Labs  Lab 01/19/20 1330 01/20/20 0639 01/21/20 0500  AST ALT ALKPHOS 89 80 85  BILITOT 0.8 0.6 0.5  PROT 7.7 7.4 7.2  ALBUMIN 4.0 3.6 3.6   No results for input(s): LIPASE, AMYLASE in the last 168 hours. No results for input(s): AMMONIA in the last 168 hours. CBC: Recent Labs   Lab 01/19/20 1330 01/20/20 0552 01/21/20 0500  WBC 9.2 4.2 7.4  NEUTROABS 6.5 2.8 5.8  HGB 13.6 13.3 13.1  HCT 42.5 40.5 39.9  MCV 99.5 98.1 98.5  PLT 202 188 212   Cardiac Enzymes: No results for input(s): CKTOTAL, CKMB, CKMBINDEX, TROPONINI in the last 168 hours. BNP: Invalid input(s): POCBNP CBG: No results for input(s): GLUCAP in the last 168 hours. D-Dimer Recent Labs    01/20/20 0552 01/21/20 0500  DDIMER 0.85* 0.56*   Hgb A1c No results for input(s): HGBA1C in the last 72 hours. Lipid Profile Recent Labs    01/19/20 1330  TRIG 104   Thyroid function studies No results for input(s): TSH, T4TOTAL, T3FREE, THYROIDAB in the last 72 hours.  Invalid input(s): FREET3 Anemia work up Recent Labs    01/20/20 0552 01/21/20 0500  FERRITIN 989* 828*   Urinalysis No results found for: COLORURINE, APPEARANCEUR, LABSPEC, PHURINE, GLUCOSEU, HGBUR, BILIRUBINUR, KETONESUR, PROTEINUR, UROBILINOGEN, NITRITE, LEUKOCYTESUR Sepsis Labs Invalid input(s): PROCALCITONIN,  WBC,  LACTICIDVEN Microbiology Recent Results (from the past 240 hour(s))  Blood Culture (routine x 2)     Status: Abnormal (Preliminary result)   Collection Time: 01/19/20  1:06 PM   Specimen: BLOOD  Result Value Ref Range Status   Specimen Description   Final    BLOOD LEFT ANTECUBITAL Performed at [facility], [address] [place], [place] [address]    Special Requests   Final    BOTTLES DRAWN AEROBIC AND ANAEROBIC Blood Culture results may not be optimal due to an inadequate volume of blood received in culture bottles Performed at [facility], [address], [place], [place] [address]    Culture  Setup Time   Final    ANAEROBIC BOTTLE ONLY GRAM POSITIVE COCCI IN CLUSTERS Organism ID to follow CRITICAL RESULT CALLED TO, READ BACK BY AND VERIFIED WITH: L POINDEXTER PHARMD 01/21/20 0214 JDW  Performed at [facility], [address], [place], [place]  [address]  Culture STAPHYLOCOCCUS EPIDERMIDIS (A)  Final   Report Status PENDING  Incomplete  Blood Culture ID Panel (Reflexed)     Status: Abnormal   Collection Time: 01/19/20  1:06 PM  Result Value Ref Range Status   Enterococcus faecalis NOT DETECTED NOT DETECTED Final   Enterococcus Faecium NOT DETECTED NOT DETECTED Final   Listeria monocytogenes NOT DETECTED NOT DETECTED Final   Staphylococcus species DETECTED (A) NOT DETECTED Final    Comment: CRITICAL RESULT CALLED TO, READ BACK BY AND VERIFIED WITH: L PONDEXTER PHARMD 01/21/20 0214 JDW    Staphylococcus aureus (BCID) NOT DETECTED NOT DETECTED Final   Staphylococcus epidermidis DETECTED (A) NOT DETECTED Final    Comment: CRITICAL RESULT CALLED TO, READ BACK BY AND VERIFIED WITH: L POINDEXTER PHARMD 01/21/20 0214 JDW    Staphylococcus lugdunensis NOT DETECTED NOT DETECTED Final   Streptococcus species NOT DETECTED NOT DETECTED Final   Streptococcus agalactiae NOT DETECTED NOT DETECTED Final   Streptococcus pneumoniae NOT DETECTED NOT DETECTED Final   Streptococcus pyogenes NOT DETECTED NOT DETECTED Final   A.calcoaceticus-baumannii NOT DETECTED NOT DETECTED Final   Bacteroides fragilis NOT DETECTED NOT DETECTED Final   Enterobacterales NOT DETECTED NOT DETECTED Final   Enterobacter cloacae complex NOT DETECTED NOT DETECTED Final   Escherichia coli NOT DETECTED NOT DETECTED Final   Klebsiella aerogenes NOT DETECTED NOT DETECTED Final   Klebsiella oxytoca NOT DETECTED NOT DETECTED Final   Klebsiella pneumoniae NOT DETECTED NOT DETECTED Final   Proteus species NOT DETECTED NOT DETECTED Final   Salmonella species NOT DETECTED NOT DETECTED Final   Serratia marcescens NOT DETECTED NOT DETECTED Final   Haemophilus influenzae NOT DETECTED NOT DETECTED Final   Neisseria meningitidis NOT DETECTED NOT DETECTED Final   Pseudomonas aeruginosa NOT DETECTED NOT DETECTED Final   Stenotrophomonas maltophilia NOT DETECTED NOT DETECTED Final    Candida albicans NOT DETECTED NOT DETECTED Final   Candida auris NOT DETECTED NOT DETECTED Final   Candida glabrata NOT DETECTED NOT DETECTED Final   Candida krusei NOT DETECTED NOT DETECTED Final   Candida parapsilosis NOT DETECTED NOT DETECTED Final   Candida tropicalis NOT DETECTED NOT DETECTED Final   Cryptococcus neoformans/gattii NOT DETECTED NOT DETECTED Final   Methicillin resistance mecA/C NOT DETECTED NOT DETECTED Final    Comment: Performed at [facility], [address], [place], [place] [address]  Blood Culture (routine x 2)     Status: None (Preliminary result)   Collection Time: 01/19/20  1:11 PM   Specimen: BLOOD  Result Value Ref Range Status   Specimen Description   Final    BLOOD RIGHT ANTECUBITAL Performed at [facility], [address], [place], [place] [address]    Special Requests   Final    BOTTLES DRAWN AEROBIC AND ANAEROBIC Blood Culture results may not be optimal due to an excessive volume of blood received in culture bottles Performed at [facility], [address] [place], [place] [address]    Culture   Final    NO GROWTH 2 DAYS Performed at [facility], [address], [place], [place] [address]    Report Status PENDING  Incomplete  SARS Coronavirus 2 by RT PCR (hospital order, performed in [facility] hospital lab) Nasopharyngeal Nasopharyngeal Swab     Status: Abnormal   Collection Time: 01/19/20  1:35 PM   Specimen: Nasopharyngeal Swab  Result Value Ref Range Status   SARS Coronavirus 2 POSITIVE (A) NEGATIVE Final  Comment: RESULT CALLED TO, READ BACK BY AND VERIFIED WITH: WEST,S. RN  01/19/20 BILLINGSLEY,L (NOTE) SARS-CoV-2 target nucleic acids are DETECTED  SARS-CoV-2 RNA is generally detectable in upper respiratory specimens  during the acute phase of infection.  Positive results are indicative  of the presence of the identified virus, but do not rule out bacterial infection or  co-infection with other pathogens not detected by the test.  Clinical correlation with patient history and  other diagnostic information is necessary to determine patient infection status.  The expected result is negative.  Fact Sheet for Patients:   BoilerBrush.com.cy   Fact Sheet for Healthcare Providers:   https://pope.com/    This test is not yet approved or cleared by the [place] FDA and  has been authorized for detection and/or diagnosis of SARS-CoV-2 by FDA under an Emergency Use Authorization (EUA).  This EUA will remain in effect (meaning thi s test can be used) for the duration of  the COVID-19 declaration under Section 564(b)(1) of the Act, 21 U.S.C. section 360-bbb-3(b)(1), unless the authorization is terminated or revoked sooner.  Performed at [facility], [address] [place], [place] [address]      Time coordinating discharge: 32 minutes  SIGNED:   Dorcas Carrow, MD  [facility] 01/21/2020, 3:28 PM

## 2020-01-21 NOTE — Discharge Instructions (Signed)
Patient scheduled for outpatient Remdesivir infusions at 2pm on Thursday 9/16 and Friday 9/17 at [facility]. Please inform the patient to park at [address], [place], as staff will be escorting the patient through the east entrance of the hospital. Appointments take approximately 45 minutes.    There is a wave flag banner located near the entrance on [address] Turn into this entrance and immediately turn left and park in 1 of the 5 designated Covid Infusion Parking spots. There is a phone number on the sign, please call and let the staff know what spot you are in and we will come out and get you. For questions call [phone].  Thanks.   

## 2020-01-21 NOTE — ED Notes (Signed)
Pt in room resting. NAD Chest rising and falling. 

## 2020-01-21 NOTE — Progress Notes (Signed)
PHARMACY - PHYSICIAN COMMUNICATION CRITICAL VALUE ALERT - BLOOD CULTURE IDENTIFICATION (BCID)  Kathleen Long is an 74 y.o. female who presented to [facility] on 01/19/2020 with Covid-19 viral pneumonia.  Assessment:  1/4 bottles +S. Epidermidis. Patient afebrile with normal WBC. Suspect likely contaminant  Name of physician (or Provider) Contacted: M. Katherina Right, FNP  Current antibiotics: Remdesivir  Changes to prescribed antibiotics recommended:  Patient is on recommended antibiotics - No changes needed  Results for orders placed or performed during the hospital encounter of 01/19/20  Blood Culture ID Panel (Reflexed) (Collected: 01/19/2020  1:06 PM)  Result Value Ref Range   Enterococcus faecalis NOT DETECTED NOT DETECTED   Enterococcus Faecium NOT DETECTED NOT DETECTED   Listeria monocytogenes NOT DETECTED NOT DETECTED   Staphylococcus species DETECTED (A) NOT DETECTED   Staphylococcus aureus (BCID) NOT DETECTED NOT DETECTED   Staphylococcus epidermidis DETECTED (A) NOT DETECTED   Staphylococcus lugdunensis NOT DETECTED NOT DETECTED   Streptococcus species NOT DETECTED NOT DETECTED   Streptococcus agalactiae NOT DETECTED NOT DETECTED   Streptococcus pneumoniae NOT DETECTED NOT DETECTED   Streptococcus pyogenes NOT DETECTED NOT DETECTED   A.calcoaceticus-baumannii NOT DETECTED NOT DETECTED   Bacteroides fragilis NOT DETECTED NOT DETECTED   Enterobacterales NOT DETECTED NOT DETECTED   Enterobacter cloacae complex NOT DETECTED NOT DETECTED   Escherichia coli NOT DETECTED NOT DETECTED   Klebsiella aerogenes NOT DETECTED NOT DETECTED   Klebsiella oxytoca NOT DETECTED NOT DETECTED   Klebsiella pneumoniae NOT DETECTED NOT DETECTED   Proteus species NOT DETECTED NOT DETECTED   Salmonella species NOT DETECTED NOT DETECTED   Serratia marcescens NOT DETECTED NOT DETECTED   Haemophilus influenzae NOT DETECTED NOT DETECTED   Neisseria meningitidis NOT DETECTED NOT DETECTED   Pseudomonas  aeruginosa NOT DETECTED NOT DETECTED   Stenotrophomonas maltophilia NOT DETECTED NOT DETECTED   Candida albicans NOT DETECTED NOT DETECTED   Candida auris NOT DETECTED NOT DETECTED   Candida glabrata NOT DETECTED NOT DETECTED   Candida krusei NOT DETECTED NOT DETECTED   Candida parapsilosis NOT DETECTED NOT DETECTED   Candida tropicalis NOT DETECTED NOT DETECTED   Cryptococcus neoformans/gattii NOT DETECTED NOT DETECTED   Methicillin resistance mecA/C NOT DETECTED NOT DETECTED    Maryellen Pile, PharmD 01/21/2020  3:04 AM

## 2020-01-21 NOTE — Progress Notes (Signed)
Patient scheduled for outpatient Remdesivir infusions at 2pm on Thursday 9/16 and Friday 9/17 at [facility]. Please inform the patient to park at [address] [place], [place], as staff will be escorting the patient through the east entrance of the hospital. Appointments take approximately 45 minutes.    There is a wave flag banner located near the entrance on [address]. Turn into this entrance and immediately turn left and park in 1 of the 5 designated Covid Infusion Parking spots. There is a phone number on the sign, please call and let the staff know what spot you are in and we will come out and get you. For questions call [phone].  Thanks.

## 2020-01-21 NOTE — ED Notes (Signed)
Patient ambulated around room and remained 90-92% on room air.   Patient repositioned in bed and given lunch tray.

## 2020-01-21 NOTE — ED Notes (Signed)
Daughter aware of pts discharge and will pick pt up soon.

## 2020-01-22 ENCOUNTER — Other Ambulatory Visit: Payer: Self-pay

## 2020-01-22 ENCOUNTER — Ambulatory Visit (HOSPITAL_COMMUNITY)
Admission: RE | Admit: 2020-01-22 | Discharge: 2020-01-22 | Disposition: A | Discharge status: Home / Self Care | Payer: Medicare HMO | Source: Ambulatory Visit | Attending: Pulmonary Disease | Admitting: Pulmonary Disease

## 2020-01-22 DIAGNOSIS — U071 COVID-19: Secondary | ICD-10-CM | POA: Insufficient documentation

## 2020-01-22 DIAGNOSIS — J1282 Pneumonia due to coronavirus disease 2019: Secondary | ICD-10-CM | POA: Insufficient documentation

## 2020-01-22 LAB — CULTURE, BLOOD (ROUTINE X 2)

## 2020-01-22 MED ORDER — SODIUM CHLORIDE 0.9 % IV SOLN: INTRAVENOUS | Status: DC | PRN

## 2020-01-22 MED ORDER — EPINEPHRINE 0.3 MG/0.3ML IJ SOAJ: 0.3000 mg | Freq: Once | INTRAMUSCULAR | Status: DC | PRN

## 2020-01-22 MED ORDER — SODIUM CHLORIDE 0.9 % IV SOLN
100.0000 mg | Freq: Once | INTRAVENOUS | Status: AC
  Administered 2020-01-22: 100 mg via INTRAVENOUS
  Filled 2020-01-22: qty 20

## 2020-01-22 MED ORDER — METHYLPREDNISOLONE SODIUM SUCC 125 MG IJ SOLR: 125.0000 mg | Freq: Once | INTRAMUSCULAR | Status: DC | PRN

## 2020-01-22 MED ORDER — FAMOTIDINE IN NACL 20-0.9 MG/50ML-% IV SOLN: 20.0000 mg | Freq: Once | INTRAVENOUS | Status: DC | PRN

## 2020-01-22 MED ORDER — ALBUTEROL SULFATE HFA 108 (90 BASE) MCG/ACT IN AERS: 2.0000 | INHALATION_SPRAY | Freq: Once | RESPIRATORY_TRACT | Status: DC | PRN

## 2020-01-22 MED ORDER — DIPHENHYDRAMINE HCL 50 MG/ML IJ SOLN: 50.0000 mg | Freq: Once | INTRAMUSCULAR | Status: DC | PRN

## 2020-01-22 NOTE — Patient Outreach (Signed)
[facility]) Care Management  01/22/2020  Evee Liska 1946/03/05 [id number]   Referral Date: 01/22/20 Referral Source: [employer] Report Date of Discharge: 01/21/20 Facility:  [facility] Insurance: [facility]   Referral received.  No outreach warranted at this time.  Transition of Care calls being completed via EMMI. RN CM will outreach patient for any red flags received.    Plan: RN CM will close case.    Bary Leriche, RN, MSN [facility] Care Management Care Management Coordinator Direct Line [phone] Toll Free: [phone]  Fax: [phone]

## 2020-01-22 NOTE — Discharge Instructions (Signed)
10 Things You Can Do to Manage Your COVID-19 Symptoms at Home If you have possible or confirmed COVID-19: 1. Stay home from work and school. And stay away from other public places. If you must go out, avoid using any kind of public transportation, ridesharing, or taxis. 2. Monitor your symptoms carefully. If your symptoms get worse, call your healthcare provider immediately. 3. Get rest and stay hydrated. 4. If you have a medical appointment, call the healthcare provider ahead of time and tell them that you have or may have COVID-19. 5. For medical emergencies, call 911 and notify the dispatch personnel that you have or may have COVID-19. 6. Cover your cough and sneezes with a tissue or use the inside of your elbow. 7. Wash your hands often with soap and water for at least 20 seconds or clean your hands with an alcohol-based hand sanitizer that contains at least 60% alcohol. 8. As much as possible, stay in a specific room and away from other people in your home. Also, you should use a separate bathroom, if available. If you need to be around other people in or outside of the home, wear a mask. 9. Avoid sharing personal items with other people in your household, like dishes, towels, and bedding. 10. Clean all surfaces that are touched often, like counters, tabletops, and doorknobs. Use household cleaning sprays or wipes according to the label instructions. cdc.gov/coronavirus 11/06/2018 This information is not intended to replace advice given to you by your health care provider. Make sure you discuss any questions you have with your health care provider. Document Revised: 04/10/2019 Document Reviewed: 04/10/2019 Elsevier Patient Education  2020 Elsevier Inc.  

## 2020-01-22 NOTE — Progress Notes (Signed)
  Diagnosis: COVID-19  Physician: DR. Delford Field  Procedure: [facility] Med: remdesivir infusion - Provided patient with remdesivir fact sheet for patients, parents and caregivers prior to infusion.  Complications: No immediate complications noted.  Discharge: Discharged home   Robb Matar 01/22/2020

## 2020-01-23 ENCOUNTER — Telehealth (HOSPITAL_BASED_OUTPATIENT_CLINIC_OR_DEPARTMENT_OTHER): Payer: Self-pay | Admitting: Emergency Medicine

## 2020-01-23 ENCOUNTER — Ambulatory Visit (HOSPITAL_COMMUNITY)
Admit: 2020-01-23 | Discharge: 2020-01-23 | Disposition: A | Discharge status: Home / Self Care | Payer: Medicare HMO | Attending: Pulmonary Disease | Admitting: Pulmonary Disease

## 2020-01-23 DIAGNOSIS — U071 COVID-19: Secondary | ICD-10-CM | POA: Diagnosis not present

## 2020-01-23 MED ORDER — SODIUM CHLORIDE 0.9 % IV SOLN: INTRAVENOUS | Status: DC | PRN

## 2020-01-23 MED ORDER — METHYLPREDNISOLONE SODIUM SUCC 125 MG IJ SOLR: 125.0000 mg | Freq: Once | INTRAMUSCULAR | Status: DC | PRN

## 2020-01-23 MED ORDER — DIPHENHYDRAMINE HCL 50 MG/ML IJ SOLN: 50.0000 mg | Freq: Once | INTRAMUSCULAR | Status: DC | PRN

## 2020-01-23 MED ORDER — ALBUTEROL SULFATE HFA 108 (90 BASE) MCG/ACT IN AERS: 2.0000 | INHALATION_SPRAY | Freq: Once | RESPIRATORY_TRACT | Status: DC | PRN

## 2020-01-23 MED ORDER — FAMOTIDINE IN NACL 20-0.9 MG/50ML-% IV SOLN: 20.0000 mg | Freq: Once | INTRAVENOUS | Status: DC | PRN

## 2020-01-23 MED ORDER — EPINEPHRINE 0.3 MG/0.3ML IJ SOAJ: 0.3000 mg | Freq: Once | INTRAMUSCULAR | Status: DC | PRN

## 2020-01-23 MED ORDER — SODIUM CHLORIDE 0.9 % IV SOLN
100.0000 mg | Freq: Once | INTRAVENOUS | Status: AC
  Administered 2020-01-23: 100 mg via INTRAVENOUS
  Filled 2020-01-23: qty 20

## 2020-01-23 NOTE — Progress Notes (Signed)
  Diagnosis: COVID-19  Physician: Shan Levans, MD  Procedure: [facility] Med: remdesivir infusion - Provided patient with remdesivir fact sheet for patients, parents and caregivers prior to infusion.  Complications: No immediate complications noted.  Discharge: Discharged home   Angelia Mould 01/23/2020

## 2020-01-23 NOTE — Discharge Instructions (Signed)
10 Things You Can Do to Manage Your COVID-19 Symptoms at Home If you have possible or confirmed COVID-19: 1. Stay home from work and school. And stay away from other public places. If you must go out, avoid using any kind of public transportation, ridesharing, or taxis. 2. Monitor your symptoms carefully. If your symptoms get worse, call your healthcare provider immediately. 3. Get rest and stay hydrated. 4. If you have a medical appointment, call the healthcare provider ahead of time and tell them that you have or may have COVID-19. 5. For medical emergencies, call 911 and notify the dispatch personnel that you have or may have COVID-19. 6. Cover your cough and sneezes with a tissue or use the inside of your elbow. 7. Wash your hands often with soap and water for at least 20 seconds or clean your hands with an alcohol-based hand sanitizer that contains at least 60% alcohol. 8. As much as possible, stay in a specific room and away from other people in your home. Also, you should use a separate bathroom, if available. If you need to be around other people in or outside of the home, wear a mask. 9. Avoid sharing personal items with other people in your household, like dishes, towels, and bedding. 10. Clean all surfaces that are touched often, like counters, tabletops, and doorknobs. Use household cleaning sprays or wipes according to the label instructions. cdc.gov/coronavirus 11/06/2018 This information is not intended to replace advice given to you by your health care provider. Make sure you discuss any questions you have with your health care provider. Document Revised: 04/10/2019 Document Reviewed: 04/10/2019 Elsevier Patient Education  2020 Elsevier Inc.  

## 2020-01-23 NOTE — Telephone Encounter (Signed)
Post ED Visit - Positive Culture Follow-up  Culture report reviewed by antimicrobial stewardship pharmacist: [facility] []  , Pharm.D. []  Enzo Bi, .D., BCPS AQ-ID []  Celedonio Miyamoto, Pharm.D., BCPS []  [address], Pharm.D., BCPS []  Cole, Garvin Fila.D., BCPS, AAHIVP []  , Pharm.D., BCPS, AAHIVP []  Georgina Pillion, PharmD, BCPS []  , PharmD, BCPS []  [place], PharmD, BCPS []  [place], PharmD []  , PharmD, BCPS []  Estella Husk, PharmD  Pharmacy Team []  Lysle Pearl, PharmD []  , PharmD []  Phillips Climes, PharmD []  , Rph []  Agapito Games) , PharmD []  Verlan Friends, PharmD []  , PharmD []  Mervyn Gay, PharmD []  , PharmD []  Vinnie Level, PharmD [x]  [facility], PharmD []  , PharmD []  Len Childs, PharmD   Positive Blood culture No further patient follow-up is required at this time.  Collins Kerby 01/23/2020, 12:52 PM

## 2020-01-24 LAB — CULTURE, BLOOD (ROUTINE X 2): Culture: NO GROWTH

## 2020-02-07 DIAGNOSIS — Z20822 Contact with and (suspected) exposure to covid-19: Secondary | ICD-10-CM | POA: Diagnosis not present

## 2020-02-20 DIAGNOSIS — R9389 Abnormal findings on diagnostic imaging of other specified body structures: Secondary | ICD-10-CM | POA: Diagnosis not present

## 2020-02-20 DIAGNOSIS — Z87891 Personal history of nicotine dependence: Secondary | ICD-10-CM | POA: Diagnosis not present

## 2020-02-20 DIAGNOSIS — Z8616 Personal history of COVID-19: Secondary | ICD-10-CM | POA: Diagnosis not present

## 2020-02-20 DIAGNOSIS — G629 Polyneuropathy, unspecified: Secondary | ICD-10-CM | POA: Diagnosis not present

## 2020-02-20 DIAGNOSIS — I10 Essential (primary) hypertension: Secondary | ICD-10-CM | POA: Diagnosis not present

## 2020-02-20 DIAGNOSIS — E782 Mixed hyperlipidemia: Secondary | ICD-10-CM | POA: Diagnosis not present

## 2020-02-20 DIAGNOSIS — M25561 Pain in right knee: Secondary | ICD-10-CM | POA: Diagnosis not present

## 2020-02-20 DIAGNOSIS — I119 Hypertensive heart disease without heart failure: Secondary | ICD-10-CM | POA: Diagnosis not present

## 2020-04-14 DIAGNOSIS — E039 Hypothyroidism, unspecified: Secondary | ICD-10-CM | POA: Diagnosis not present

## 2020-04-14 DIAGNOSIS — J302 Other seasonal allergic rhinitis: Secondary | ICD-10-CM | POA: Diagnosis not present

## 2020-04-14 DIAGNOSIS — M25561 Pain in right knee: Secondary | ICD-10-CM | POA: Diagnosis not present

## 2020-04-14 DIAGNOSIS — I10 Essential (primary) hypertension: Secondary | ICD-10-CM | POA: Diagnosis not present

## 2020-04-14 DIAGNOSIS — G629 Polyneuropathy, unspecified: Secondary | ICD-10-CM | POA: Diagnosis not present

## 2020-04-15 DIAGNOSIS — M25561 Pain in right knee: Secondary | ICD-10-CM | POA: Diagnosis not present

## 2020-04-27 ENCOUNTER — Other Ambulatory Visit (HOSPITAL_BASED_OUTPATIENT_CLINIC_OR_DEPARTMENT_OTHER): Payer: Self-pay

## 2020-04-27 DIAGNOSIS — G4733 Obstructive sleep apnea (adult) (pediatric): Secondary | ICD-10-CM

## 2020-06-15 ENCOUNTER — Other Ambulatory Visit: Payer: Self-pay

## 2020-06-15 ENCOUNTER — Ambulatory Visit (HOSPITAL_BASED_OUTPATIENT_CLINIC_OR_DEPARTMENT_OTHER): Payer: Medicare HMO | Attending: Internal Medicine | Admitting: Internal Medicine

## 2020-06-15 VITALS — Ht 62.0 in | Wt 226.0 lb

## 2020-06-15 DIAGNOSIS — R0683 Snoring: Secondary | ICD-10-CM | POA: Diagnosis not present

## 2020-06-15 DIAGNOSIS — G4733 Obstructive sleep apnea (adult) (pediatric): Secondary | ICD-10-CM

## 2020-06-15 HISTORY — DX: Obstructive sleep apnea (adult) (pediatric): G47.33

## 2020-06-16 DIAGNOSIS — M25561 Pain in right knee: Secondary | ICD-10-CM | POA: Diagnosis not present

## 2020-06-16 DIAGNOSIS — G629 Polyneuropathy, unspecified: Secondary | ICD-10-CM | POA: Diagnosis not present

## 2020-06-16 DIAGNOSIS — J302 Other seasonal allergic rhinitis: Secondary | ICD-10-CM | POA: Diagnosis not present

## 2020-06-16 DIAGNOSIS — I10 Essential (primary) hypertension: Secondary | ICD-10-CM | POA: Diagnosis not present

## 2020-06-16 DIAGNOSIS — E039 Hypothyroidism, unspecified: Secondary | ICD-10-CM | POA: Diagnosis not present

## 2020-06-20 NOTE — Procedures (Signed)
    Patient Name: Kathleen Long, Heidel Date: 06/15/2020 Gender: Female D.O.B: 05/21/1945 Age (years): 74 Referring Provider: Amada Kingfisher Bonsu Height (inches): 62 Interpreting Physician: Jetty Duhamel MD, ABSM Weight (lbs): 226 RPSGT: Rosette Reveal BMI: 41 MRN: [id number] Neck Size: 15.00  CLINICAL INFORMATION Sleep Study Type: NPSG Indication for sleep study: Fatigue, Hypertension, Obesity, Snoring  Epworth Sleepiness Score:21   Most recent polysomnogram dated 07/14/2016 revealed an AHI of 44.4/h and RDI of 45.0/h.  SLEEP STUDY TECHNIQUE As per the AASM Manual for the Scoring of Sleep and Associated Events v2.3 (April 2016) with a hypopnea requiring 4% desaturations.  The channels recorded and monitored were frontal, central and occipital EEG, electrooculogram (EOG), submentalis EMG (chin), nasal and oral airflow, thoracic and abdominal wall motion, anterior tibialis EMG, snore microphone, electrocardiogram, and pulse oximetry.  MEDICATIONS Medications self-administered by patient taken the night of the study : none reported  SLEEP ARCHITECTURE The study was initiated at 11:01:15 PM and ended at 5:07:33 AM.  Sleep onset time was N/A minutes and the sleep efficiency was 0.0%%. The total sleep time was 0 minutes.  Stage REM latency was N/A minutes.  The patient spent N/A% of the night in stage N1 sleep, N/A% in stage N2 sleep, N/A% in stage N3 and 0% in REM.  Alpha intrusion was absent.  Supine sleep was N/A%.  RESPIRATORY PARAMETERS The overall apnea/hypopnea index (AHI) was N/A per hour. There were N/A total apneas, including N/A obstructive, N/A central and N/A mixed apneas. There were N/A hypopneas and N/A RERAs.  The AHI during Stage REM sleep was N/A per hour.  AHI while supine was N/A per hour.  The mean oxygen saturation was N/A%. The minimum SpO2 during sleep was N/A%.  snoring was noted during this study.  CARDIAC DATA The 2 lead EKG demonstrated  sinus rhythm. The mean heart rate was N/A beats per minute. Other EKG findings include: None.  LEG MOVEMENT DATA The total PLMS were N/A with a resulting PLMS index of N/A. Associated arousal with leg movement index was N/A .  IMPRESSIONS - Patient had no sleep during this study - No snoring was audible during this study. - No cardiac abnormalities were noted during this study.  DIAGNOSIS  RECOMMENDATIONS - If appropriate, contact the [facility] to reschedule, poosssibly with a sedaive-hypnotic.  [Electronically signed] 06/20/2020 10:48 AM  Jetty Duhamel MD, ABSM Diplomate, American Board of Sleep Medicine   NPI: [id number]                         Jetty Duhamel Diplomate, American Board of Sleep Medicine  ELECTRONICALLY SIGNED ON:  06/20/2020, 10:49 AM [facility] PH: [phone]   FX: [phone] ACCREDITED BY THE AMERICAN ACADEMY OF SLEEP MEDICINE

## 2020-07-15 DIAGNOSIS — M25561 Pain in right knee: Secondary | ICD-10-CM | POA: Diagnosis not present

## 2020-08-18 DIAGNOSIS — M25561 Pain in right knee: Secondary | ICD-10-CM | POA: Diagnosis not present

## 2020-08-18 DIAGNOSIS — J302 Other seasonal allergic rhinitis: Secondary | ICD-10-CM | POA: Diagnosis not present

## 2020-08-18 DIAGNOSIS — I10 Essential (primary) hypertension: Secondary | ICD-10-CM | POA: Diagnosis not present

## 2020-08-18 DIAGNOSIS — G629 Polyneuropathy, unspecified: Secondary | ICD-10-CM | POA: Diagnosis not present

## 2020-08-18 DIAGNOSIS — E039 Hypothyroidism, unspecified: Secondary | ICD-10-CM | POA: Diagnosis not present

## 2020-10-12 DIAGNOSIS — M1711 Unilateral primary osteoarthritis, right knee: Secondary | ICD-10-CM | POA: Diagnosis not present

## 2020-10-19 DIAGNOSIS — M1711 Unilateral primary osteoarthritis, right knee: Secondary | ICD-10-CM | POA: Diagnosis not present

## 2020-10-25 DIAGNOSIS — M1711 Unilateral primary osteoarthritis, right knee: Secondary | ICD-10-CM | POA: Diagnosis not present

## 2020-11-10 IMAGING — DX DG CHEST 1V PORT
1 series · 1 of 1 positions shown · non-contrast
Comparison: None.

CLINICAL DATA: Cough, shortness of breath.

EXAM:
PORTABLE CHEST 1 VIEW

[chest ap]
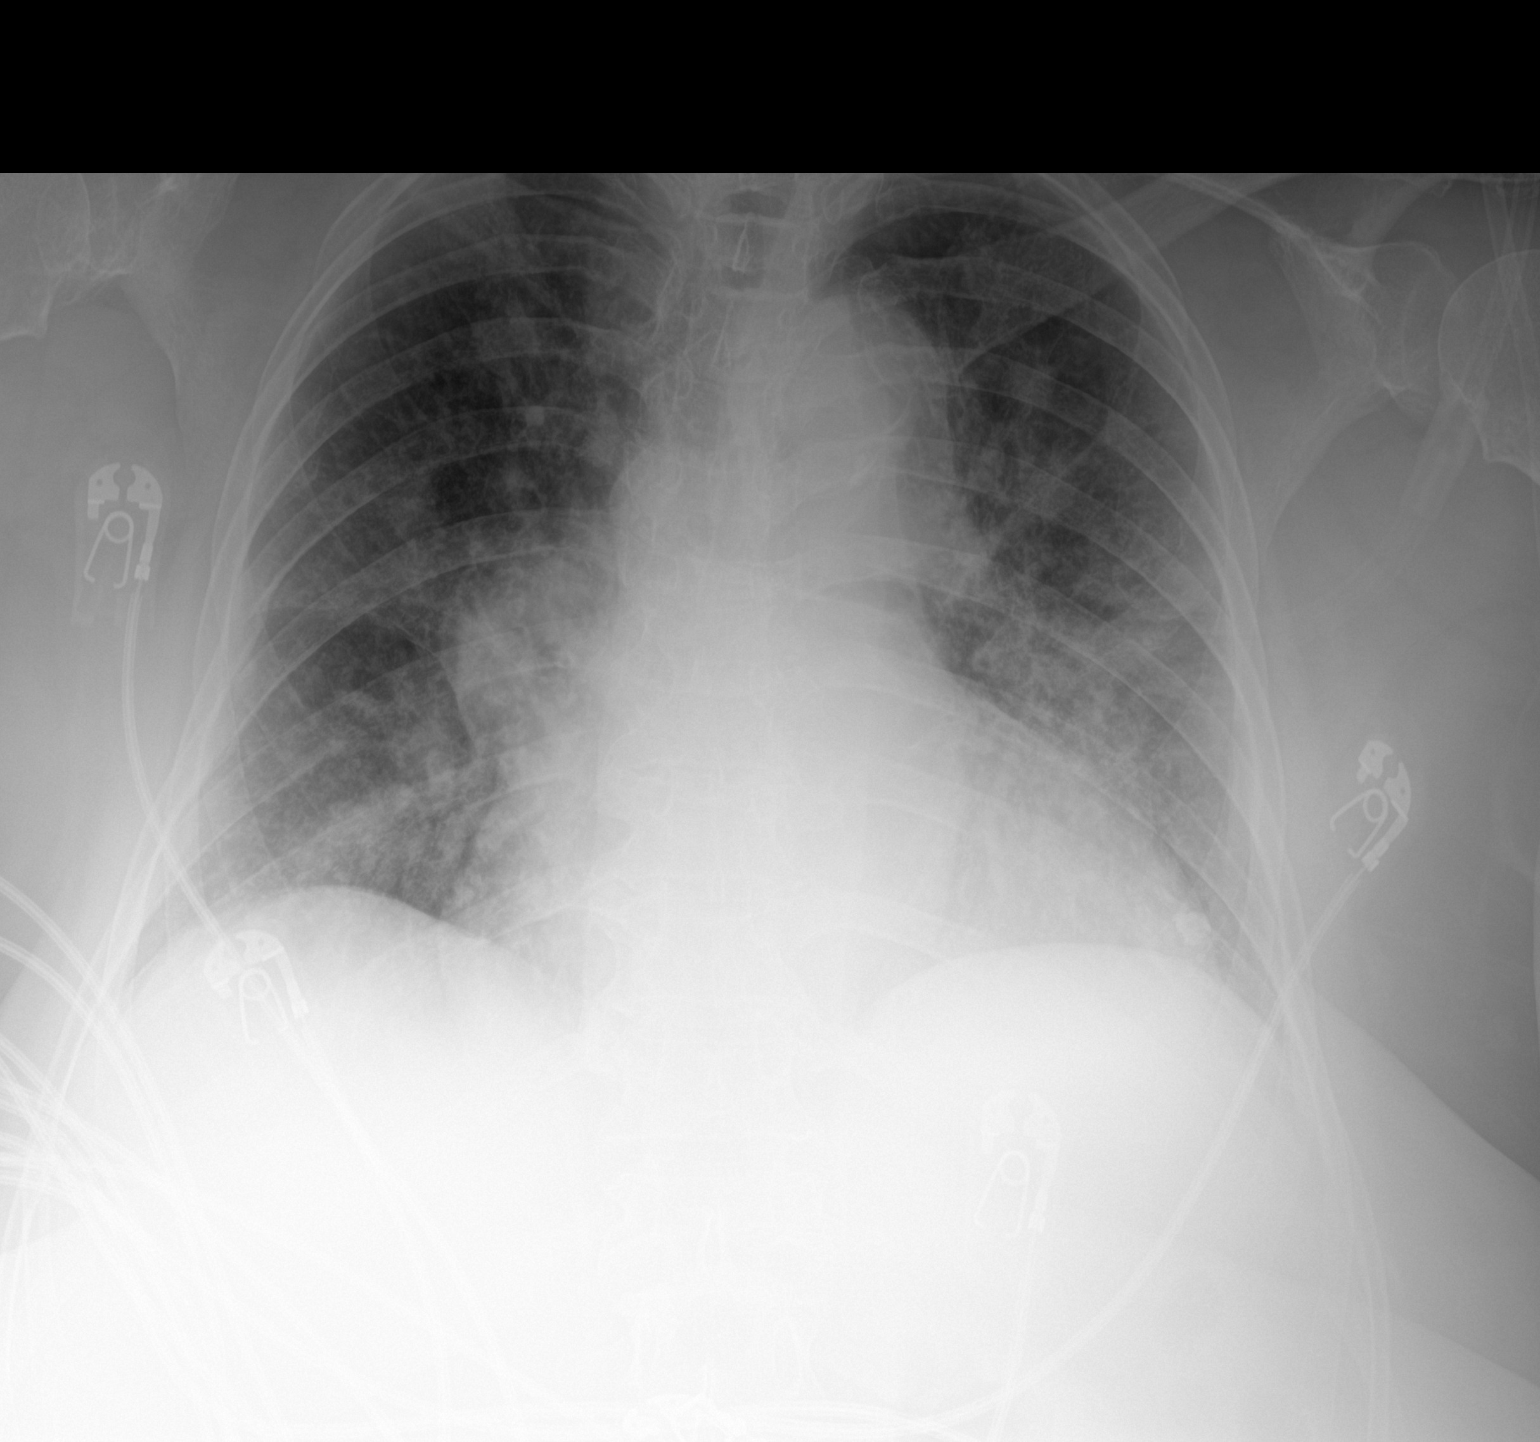

[1 of 1 positions shown; findings below may reference images not displayed]

FINDINGS: Mild cardiomegaly is noted. No pneumothorax or pleural effusion is
noted. Multiple ill-defined airspace opacities are noted throughout
both lungs concerning for multifocal pneumonia such as [id number]. The
visualized skeletal structures are unremarkable.
IMPRESSION: Multiple ill-defined airspace opacities are noted throughout both
lungs concerning for multifocal pneumonia such as [id number].

## 2021-02-16 DIAGNOSIS — E039 Hypothyroidism, unspecified: Secondary | ICD-10-CM | POA: Diagnosis not present

## 2021-02-16 DIAGNOSIS — Z0000 Encounter for general adult medical examination without abnormal findings: Secondary | ICD-10-CM | POA: Diagnosis not present

## 2021-02-16 DIAGNOSIS — G629 Polyneuropathy, unspecified: Secondary | ICD-10-CM | POA: Diagnosis not present

## 2021-02-16 DIAGNOSIS — Z131 Encounter for screening for diabetes mellitus: Secondary | ICD-10-CM | POA: Diagnosis not present

## 2021-02-16 DIAGNOSIS — M25561 Pain in right knee: Secondary | ICD-10-CM | POA: Diagnosis not present

## 2021-02-16 DIAGNOSIS — J302 Other seasonal allergic rhinitis: Secondary | ICD-10-CM | POA: Diagnosis not present

## 2021-02-16 DIAGNOSIS — Z6841 Body Mass Index (BMI) 40.0 and over, adult: Secondary | ICD-10-CM | POA: Diagnosis not present

## 2021-02-16 DIAGNOSIS — I10 Essential (primary) hypertension: Secondary | ICD-10-CM | POA: Diagnosis not present

## 2021-08-19 DIAGNOSIS — I10 Essential (primary) hypertension: Secondary | ICD-10-CM | POA: Diagnosis not present

## 2021-08-19 DIAGNOSIS — J302 Other seasonal allergic rhinitis: Secondary | ICD-10-CM | POA: Diagnosis not present

## 2021-08-19 DIAGNOSIS — Z0001 Encounter for general adult medical examination with abnormal findings: Secondary | ICD-10-CM | POA: Diagnosis not present

## 2021-08-19 DIAGNOSIS — E039 Hypothyroidism, unspecified: Secondary | ICD-10-CM | POA: Diagnosis not present

## 2021-11-22 DIAGNOSIS — J302 Other seasonal allergic rhinitis: Secondary | ICD-10-CM | POA: Diagnosis not present

## 2021-11-22 DIAGNOSIS — E039 Hypothyroidism, unspecified: Secondary | ICD-10-CM | POA: Diagnosis not present

## 2021-11-22 DIAGNOSIS — I10 Essential (primary) hypertension: Secondary | ICD-10-CM | POA: Diagnosis not present

## 2021-11-22 DIAGNOSIS — G629 Polyneuropathy, unspecified: Secondary | ICD-10-CM | POA: Diagnosis not present

## 2022-03-06 DIAGNOSIS — G629 Polyneuropathy, unspecified: Secondary | ICD-10-CM | POA: Diagnosis not present

## 2022-03-06 DIAGNOSIS — R7309 Other abnormal glucose: Secondary | ICD-10-CM | POA: Diagnosis not present

## 2022-03-06 DIAGNOSIS — Z131 Encounter for screening for diabetes mellitus: Secondary | ICD-10-CM | POA: Diagnosis not present

## 2022-03-06 DIAGNOSIS — J302 Other seasonal allergic rhinitis: Secondary | ICD-10-CM | POA: Diagnosis not present

## 2022-03-06 DIAGNOSIS — I10 Essential (primary) hypertension: Secondary | ICD-10-CM | POA: Diagnosis not present

## 2022-03-06 DIAGNOSIS — Z23 Encounter for immunization: Secondary | ICD-10-CM | POA: Diagnosis not present

## 2022-03-06 DIAGNOSIS — E039 Hypothyroidism, unspecified: Secondary | ICD-10-CM | POA: Diagnosis not present

## 2022-03-06 DIAGNOSIS — Z0000 Encounter for general adult medical examination without abnormal findings: Secondary | ICD-10-CM | POA: Diagnosis not present

## 2022-03-28 DIAGNOSIS — L309 Dermatitis, unspecified: Secondary | ICD-10-CM | POA: Diagnosis not present

## 2022-03-28 DIAGNOSIS — J302 Other seasonal allergic rhinitis: Secondary | ICD-10-CM | POA: Diagnosis not present

## 2022-03-28 DIAGNOSIS — G629 Polyneuropathy, unspecified: Secondary | ICD-10-CM | POA: Diagnosis not present

## 2022-03-28 DIAGNOSIS — Z0001 Encounter for general adult medical examination with abnormal findings: Secondary | ICD-10-CM | POA: Diagnosis not present

## 2022-03-28 DIAGNOSIS — I10 Essential (primary) hypertension: Secondary | ICD-10-CM | POA: Diagnosis not present

## 2022-03-28 DIAGNOSIS — E039 Hypothyroidism, unspecified: Secondary | ICD-10-CM | POA: Diagnosis not present

## 2022-06-27 DIAGNOSIS — E039 Hypothyroidism, unspecified: Secondary | ICD-10-CM | POA: Diagnosis not present

## 2022-06-27 DIAGNOSIS — L309 Dermatitis, unspecified: Secondary | ICD-10-CM | POA: Diagnosis not present

## 2022-06-27 DIAGNOSIS — G629 Polyneuropathy, unspecified: Secondary | ICD-10-CM | POA: Diagnosis not present

## 2022-06-27 DIAGNOSIS — J302 Other seasonal allergic rhinitis: Secondary | ICD-10-CM | POA: Diagnosis not present

## 2022-06-27 DIAGNOSIS — I10 Essential (primary) hypertension: Secondary | ICD-10-CM | POA: Diagnosis not present

## 2022-11-14 DIAGNOSIS — I10 Essential (primary) hypertension: Secondary | ICD-10-CM | POA: Diagnosis not present

## 2022-11-14 DIAGNOSIS — E039 Hypothyroidism, unspecified: Secondary | ICD-10-CM | POA: Diagnosis not present

## 2022-11-14 DIAGNOSIS — J302 Other seasonal allergic rhinitis: Secondary | ICD-10-CM | POA: Diagnosis not present

## 2022-11-14 DIAGNOSIS — I83893 Varicose veins of bilateral lower extremities with other complications: Secondary | ICD-10-CM | POA: Diagnosis not present

## 2022-11-14 DIAGNOSIS — G629 Polyneuropathy, unspecified: Secondary | ICD-10-CM | POA: Diagnosis not present

## 2022-11-23 DIAGNOSIS — E039 Hypothyroidism, unspecified: Secondary | ICD-10-CM | POA: Diagnosis not present

## 2022-11-23 DIAGNOSIS — I10 Essential (primary) hypertension: Secondary | ICD-10-CM | POA: Diagnosis not present

## 2022-11-23 DIAGNOSIS — J302 Other seasonal allergic rhinitis: Secondary | ICD-10-CM | POA: Diagnosis not present

## 2022-11-23 DIAGNOSIS — G629 Polyneuropathy, unspecified: Secondary | ICD-10-CM | POA: Diagnosis not present

## 2022-11-23 DIAGNOSIS — N289 Disorder of kidney and ureter, unspecified: Secondary | ICD-10-CM | POA: Diagnosis not present

## 2022-11-23 DIAGNOSIS — I83893 Varicose veins of bilateral lower extremities with other complications: Secondary | ICD-10-CM | POA: Diagnosis not present

## 2022-12-13 DIAGNOSIS — I10 Essential (primary) hypertension: Secondary | ICD-10-CM | POA: Diagnosis not present

## 2022-12-13 DIAGNOSIS — J302 Other seasonal allergic rhinitis: Secondary | ICD-10-CM | POA: Diagnosis not present

## 2022-12-13 DIAGNOSIS — E039 Hypothyroidism, unspecified: Secondary | ICD-10-CM | POA: Diagnosis not present

## 2022-12-13 DIAGNOSIS — I83893 Varicose veins of bilateral lower extremities with other complications: Secondary | ICD-10-CM | POA: Diagnosis not present

## 2022-12-13 DIAGNOSIS — G629 Polyneuropathy, unspecified: Secondary | ICD-10-CM | POA: Diagnosis not present

## 2023-03-14 DIAGNOSIS — E039 Hypothyroidism, unspecified: Secondary | ICD-10-CM | POA: Diagnosis not present

## 2023-03-14 DIAGNOSIS — L309 Dermatitis, unspecified: Secondary | ICD-10-CM | POA: Diagnosis not present

## 2023-03-14 DIAGNOSIS — J302 Other seasonal allergic rhinitis: Secondary | ICD-10-CM | POA: Diagnosis not present

## 2023-03-14 DIAGNOSIS — I83893 Varicose veins of bilateral lower extremities with other complications: Secondary | ICD-10-CM | POA: Diagnosis not present

## 2023-03-14 DIAGNOSIS — I10 Essential (primary) hypertension: Secondary | ICD-10-CM | POA: Diagnosis not present

## 2023-03-14 DIAGNOSIS — G629 Polyneuropathy, unspecified: Secondary | ICD-10-CM | POA: Diagnosis not present

## 2023-04-10 DIAGNOSIS — I83893 Varicose veins of bilateral lower extremities with other complications: Secondary | ICD-10-CM | POA: Diagnosis not present

## 2023-04-10 DIAGNOSIS — J302 Other seasonal allergic rhinitis: Secondary | ICD-10-CM | POA: Diagnosis not present

## 2023-04-10 DIAGNOSIS — G629 Polyneuropathy, unspecified: Secondary | ICD-10-CM | POA: Diagnosis not present

## 2023-04-10 DIAGNOSIS — E039 Hypothyroidism, unspecified: Secondary | ICD-10-CM | POA: Diagnosis not present

## 2023-04-10 DIAGNOSIS — L309 Dermatitis, unspecified: Secondary | ICD-10-CM | POA: Diagnosis not present

## 2023-04-10 DIAGNOSIS — I10 Essential (primary) hypertension: Secondary | ICD-10-CM | POA: Diagnosis not present

## 2023-04-10 DIAGNOSIS — Z0000 Encounter for general adult medical examination without abnormal findings: Secondary | ICD-10-CM | POA: Diagnosis not present

## 2023-06-16 DIAGNOSIS — H524 Presbyopia: Secondary | ICD-10-CM | POA: Diagnosis not present

## 2023-06-16 DIAGNOSIS — Z0100 Encounter for examination of eyes and vision without abnormal findings: Secondary | ICD-10-CM | POA: Diagnosis not present

## 2023-07-13 DIAGNOSIS — I83893 Varicose veins of bilateral lower extremities with other complications: Secondary | ICD-10-CM | POA: Diagnosis not present

## 2023-07-13 DIAGNOSIS — J189 Pneumonia, unspecified organism: Secondary | ICD-10-CM | POA: Diagnosis not present

## 2023-07-13 DIAGNOSIS — G629 Polyneuropathy, unspecified: Secondary | ICD-10-CM | POA: Diagnosis not present

## 2023-07-13 DIAGNOSIS — E039 Hypothyroidism, unspecified: Secondary | ICD-10-CM | POA: Diagnosis not present

## 2023-07-13 DIAGNOSIS — L309 Dermatitis, unspecified: Secondary | ICD-10-CM | POA: Diagnosis not present

## 2023-07-13 DIAGNOSIS — I10 Essential (primary) hypertension: Secondary | ICD-10-CM | POA: Diagnosis not present

## 2023-07-13 DIAGNOSIS — R0602 Shortness of breath: Secondary | ICD-10-CM | POA: Diagnosis not present

## 2023-08-06 DIAGNOSIS — L309 Dermatitis, unspecified: Secondary | ICD-10-CM | POA: Diagnosis not present

## 2023-08-06 DIAGNOSIS — I83893 Varicose veins of bilateral lower extremities with other complications: Secondary | ICD-10-CM | POA: Diagnosis not present

## 2023-08-06 DIAGNOSIS — E039 Hypothyroidism, unspecified: Secondary | ICD-10-CM | POA: Diagnosis not present

## 2023-08-06 DIAGNOSIS — G629 Polyneuropathy, unspecified: Secondary | ICD-10-CM | POA: Diagnosis not present

## 2023-08-06 DIAGNOSIS — I10 Essential (primary) hypertension: Secondary | ICD-10-CM | POA: Diagnosis not present

## 2023-08-06 DIAGNOSIS — J302 Other seasonal allergic rhinitis: Secondary | ICD-10-CM | POA: Diagnosis not present

## 2023-08-20 DIAGNOSIS — G629 Polyneuropathy, unspecified: Secondary | ICD-10-CM | POA: Diagnosis not present

## 2023-08-20 DIAGNOSIS — M8589 Other specified disorders of bone density and structure, multiple sites: Secondary | ICD-10-CM | POA: Diagnosis not present

## 2023-08-20 DIAGNOSIS — J302 Other seasonal allergic rhinitis: Secondary | ICD-10-CM | POA: Diagnosis not present

## 2023-08-20 DIAGNOSIS — I10 Essential (primary) hypertension: Secondary | ICD-10-CM | POA: Diagnosis not present

## 2023-08-20 DIAGNOSIS — Z8701 Personal history of pneumonia (recurrent): Secondary | ICD-10-CM | POA: Diagnosis not present

## 2023-08-20 DIAGNOSIS — I83893 Varicose veins of bilateral lower extremities with other complications: Secondary | ICD-10-CM | POA: Diagnosis not present

## 2023-08-20 DIAGNOSIS — L309 Dermatitis, unspecified: Secondary | ICD-10-CM | POA: Diagnosis not present

## 2023-08-20 DIAGNOSIS — E039 Hypothyroidism, unspecified: Secondary | ICD-10-CM | POA: Diagnosis not present

## 2023-08-21 ENCOUNTER — Other Ambulatory Visit: Payer: Self-pay | Admitting: Physician Assistant

## 2023-08-21 DIAGNOSIS — M8589 Other specified disorders of bone density and structure, multiple sites: Secondary | ICD-10-CM

## 2023-08-27 DIAGNOSIS — G629 Polyneuropathy, unspecified: Secondary | ICD-10-CM | POA: Diagnosis not present

## 2023-08-27 DIAGNOSIS — I10 Essential (primary) hypertension: Secondary | ICD-10-CM | POA: Diagnosis not present

## 2023-08-27 DIAGNOSIS — I83893 Varicose veins of bilateral lower extremities with other complications: Secondary | ICD-10-CM | POA: Diagnosis not present

## 2023-08-27 DIAGNOSIS — E039 Hypothyroidism, unspecified: Secondary | ICD-10-CM | POA: Diagnosis not present

## 2023-08-27 DIAGNOSIS — M8589 Other specified disorders of bone density and structure, multiple sites: Secondary | ICD-10-CM | POA: Diagnosis not present

## 2023-08-27 DIAGNOSIS — J302 Other seasonal allergic rhinitis: Secondary | ICD-10-CM | POA: Diagnosis not present

## 2023-08-27 DIAGNOSIS — I509 Heart failure, unspecified: Secondary | ICD-10-CM | POA: Diagnosis not present

## 2023-10-23 ENCOUNTER — Other Ambulatory Visit: Payer: Self-pay

## 2023-10-23 DIAGNOSIS — I83893 Varicose veins of bilateral lower extremities with other complications: Secondary | ICD-10-CM | POA: Insufficient documentation

## 2023-10-23 DIAGNOSIS — I509 Heart failure, unspecified: Secondary | ICD-10-CM | POA: Insufficient documentation

## 2023-10-23 DIAGNOSIS — J302 Other seasonal allergic rhinitis: Secondary | ICD-10-CM | POA: Insufficient documentation

## 2023-10-23 DIAGNOSIS — E039 Hypothyroidism, unspecified: Secondary | ICD-10-CM | POA: Insufficient documentation

## 2023-10-23 DIAGNOSIS — I10 Essential (primary) hypertension: Secondary | ICD-10-CM | POA: Insufficient documentation

## 2023-10-23 DIAGNOSIS — G629 Polyneuropathy, unspecified: Secondary | ICD-10-CM | POA: Insufficient documentation

## 2023-10-24 ENCOUNTER — Encounter: Payer: Self-pay | Admitting: Cardiology

## 2023-10-24 ENCOUNTER — Ambulatory Visit: Attending: Cardiology | Admitting: Cardiology

## 2023-10-24 VITALS — BP 124/72 | HR 72 | Ht 62.0 in | Wt 223.0 lb

## 2023-10-24 DIAGNOSIS — R011 Cardiac murmur, unspecified: Secondary | ICD-10-CM | POA: Diagnosis not present

## 2023-10-24 DIAGNOSIS — R0609 Other forms of dyspnea: Secondary | ICD-10-CM

## 2023-10-24 DIAGNOSIS — I10 Essential (primary) hypertension: Secondary | ICD-10-CM | POA: Diagnosis not present

## 2023-10-24 NOTE — Patient Instructions (Signed)
 Medication Instructions:  Your physician recommends that you continue on your current medications as directed. Please refer to the Current Medication list given to you today.  *If you need a refill on your cardiac medications before your next appointment, please call your pharmacy*  Lab Work: None If you have labs (blood work) drawn today and your tests are completely normal, you will receive your results only by: MyChart Message (if you have MyChart) OR A paper copy in the mail If you have any lab test that is abnormal or we need to change your treatment, we will call you to review the results.  Testing/Procedures: Your physician has requested that you have an echocardiogram. Echocardiography is a painless test that uses sound waves to create images of your heart. It provides your doctor with information about the size and shape of your heart and how well your heart's chambers and valves are working. This procedure takes approximately one hour. There are no restrictions for this procedure. Please do NOT wear cologne, perfume, aftershave, or lotions (deodorant is allowed). Please arrive 15 minutes prior to your appointment time.  Please note: We ask at that you not bring children with you during ultrasound (echo/ vascular) testing. Due to room size and safety concerns, children are not allowed in the ultrasound rooms during exams. Our front office staff cannot provide observation of children in our lobby area while testing is being conducted. An adult accompanying a patient to their appointment will only be allowed in the ultrasound room at the discretion of the ultrasound technician under special circumstances. We apologize for any inconvenience.    [facility] at [facility] [address] [place], [place] [address] Phone: [phone]    Please arrive 15 minutes prior to your appointment time for registration and insurance purposes.  The test will take  approximately 3 to 4 hours to complete; you may bring reading material.  If someone comes with you to your appointment, they will need to remain in the main lobby due to limited space in the testing area. **If you are pregnant or breastfeeding, please notify the nuclear lab prior to your appointment**  How to prepare for your Myocardial Perfusion Test: Do not eat or drink 3 hours prior to your test, except you may have water. Do not consume products containing caffeine (regular or decaffeinated) 12 hours prior to your test. (ex: coffee, chocolate, sodas, tea). Do wear comfortable clothes (no dresses or overalls) and walking shoes, tennis shoes preferred (No heels or open toe shoes are allowed). Do NOT wear cologne, perfume, aftershave, or lotions (deodorant is allowed). If these instructions are not followed, your test will have to be rescheduled.  Por favor presntese a la direccin [address] para su examen.  Si tiene [place] inquietud o pregunta sobre su cita, puede llamar al Nuclear Lab at [phone].  If you cannot keep your appointment, please provide 24 hours notification to the Nuclear Lab, to avoid a possible $50 charge to your account.   Follow-Up: At [facility], you and your health needs are our priority.  As part of our continuing mission to provide you with exceptional heart care, our providers are all part of one team.  This team includes your primary Cardiologist (physician) and Advanced Practice Providers or APPs (Physician Assistants and Nurse Practitioners) who all work together to provide you with the care you need, when you need it.  Your next appointment:   6 month(s)  Provider:  Hillis Lu, MD    We recommend signing up for the patient portal called MyChart.  Sign up information is provided on this After Visit Summary.  MyChart is used to connect with patients for Virtual Visits (Telemedicine).  Patients are able to view lab/test  results, encounter notes, upcoming appointments, etc.  Non-urgent messages can be sent to your provider as well.   To learn more about what you can do with MyChart, go to [email or web address].   Other Instructions None

## 2023-10-24 NOTE — Progress Notes (Signed)
 Cardiology Office Note:    Date:  10/24/2023   ID:  Kathleen Long, DOB Sep 07, 1945, MRN [id number]  PCP:  Tretha Fu, MD  Cardiologist:  Nelia Balzarine, MD   Referring MD: Dianah Fort, PA    ASSESSMENT:    1. Essential hypertension   2. Morbid obesity (HCC)   3. Dyspnea on exertion   4. Cardiac murmur    PLAN:    In order of problems listed above:  Primary prevention stressed with the patient.  Importance of compliance with diet medication stressed and patient verbalized standing.  I reviewed records from primary care extensively. Dyspnea on exertion: Patient has multiple risk factors for coronary artery disease and will do a Lexiscan sestamibi.  This will help me assess for any ischemic substrate. Cardiac murmur: Echocardiogram will be done to assess murmur heard on auscultation. Morbid obesity: Weight reduction stressed diet emphasized and she promises to do better. Essential hypertension: Blood pressure stable and diet was emphasized. Further recommendations will be made based on the findings of the aforementioned tests. Patient will be seen in follow-up appointment in 6 months or earlier if the patient has any concerns.    Medication Adjustments/Labs and Tests Ordered: Current medicines are reviewed at length with the patient today.  Concerns regarding medicines are outlined above.  Orders Placed This Encounter  Procedures   EKG 12-Lead   No orders of the defined types were placed in this encounter.    History of Present Illness:    Kathleen Long is a 78 y.o. female who is being seen today for the evaluation of dyspnea on exertion at the request of Dianah Fort, [place].  Patient is a pleasant 78 year old female.  She has past medical history of essential hypertension.  She is morbidly obese and leads a sedentary lifestyle because of orthopedic issues affecting her knee.  She has obstructive sleep apnea.  She mentions to me that she is having some  shortness of breath on exertion.  She is accompanied by her daughter who interprets for her.  I asked for official interpreter and the patient and daughter mention that they would not not need one and that they would prefer to interpret.  She has sleep apnea.  No chest pain orthopnea PND.  At the time of my evaluation, the patient is alert awake oriented and in no distress.  Past Medical History:  Diagnosis Date   Acute respiratory failure due to COVID-19 [facility])    CHF (congestive heart failure) (HCC)    Essential hypertension    Hypertension    Hypothyroidism    Mild congestive heart failure (HCC)    Morbid obesity (HCC)    Neuropathy    OSA (obstructive sleep apnea) 06/15/2020   Pneumonia due to COVID-19 virus 01/19/2020   Seasonal allergies    Symptomatic varicose veins of both lower extremities    Thyroid  disease     Past Surgical History:  Procedure Laterality Date   CESAREAN SECTION  02/1986    Current Medications: Current Meds  Medication Sig   amLODipine  (NORVASC ) 10 MG tablet Take 10 mg by mouth daily.   cetirizine (ZYRTEC) 10 MG tablet Take 10 mg by mouth daily.   Cholecalciferol (VITAMIN D3 PO) Take 1 tablet by mouth daily.   clotrimazole-betamethasone  (LOTRISONE) cream Apply 1 application topically 2 (two) times daily as needed (neck rash).    furosemide  (LASIX ) 20 MG tablet Take 20 mg by mouth daily.   gabapentin  (NEURONTIN ) 300 MG capsule Take  300 mg by mouth at bedtime.   hydrochlorothiazide (HYDRODIURIL) 12.5 MG tablet Take 12.5 mg by mouth daily.   ibuprofen (ADVIL) 200 MG tablet Take 400 mg by mouth 2 (two) times daily as needed for fever.   levothyroxine  (SYNTHROID ) 125 MCG tablet Take 125 mcg by mouth daily.   meloxicam (MOBIC) 7.5 MG tablet Take 7.5 mg by mouth daily.     Allergies:   Patient has no known allergies.   Social History   Socioeconomic History   Marital status: Unknown    Spouse name: Not on file   Number of children: Not on file   Years  of education: Not on file   Highest education level: Not on file  Occupational History   Not on file  Tobacco Use   Smoking status: Former    Types: Cigarettes   Smokeless tobacco: Never  Substance and Sexual Activity   Alcohol use: Never   Drug use: Never   Sexual activity: Not on file  Other Topics Concern   Not on file  Social History Narrative   Not on file   Social Drivers of Health   Financial Resource Strain: Not on file  Food Insecurity: Not on file  Transportation Needs: Not on file  Physical Activity: Not on file  Stress: Not on file  Social Connections: Not on file     Family History: The patient's family history includes Cancer in her sister; Cardiomyopathy in her mother.  ROS:   Please see the history of present illness.    All other systems reviewed and are negative.  EKGs/Labs/Other Studies Reviewed:    The following studies were reviewed today:  EKG Interpretation Date/Time:  Wednesday October 24 2023 15:58:13 EDT Ventricular Rate:  72 PR Interval:  134 QRS Duration:  98 QT Interval:  396 QTC Calculation: 433 R Axis:   -59  Text Interpretation: Normal sinus rhythm with sinus arrhythmia Left axis deviation Possible Anterior infarct , age undetermined When compared with ECG of 19-Jan-2020 13:37, PREVIOUS ECG IS PRESENT Confirmed by Hillis Lu [phone]) on 10/24/2023 4:01:19 PM     Recent Labs: No results found for requested labs within last 365 days.  Recent Lipid Panel    Component Value Date/Time   TRIG 104 01/19/2020 1330    Physical Exam:    VS:  BP 124/72   Pulse 72   Ht 5' 2 (1.575 m)   Wt 223 lb (101.2 kg)   SpO2 95%   BMI 40.79 kg/m     Wt Readings from Last 3 Encounters:  10/24/23 223 lb (101.2 kg)  06/15/20 226 lb (102.5 kg)  01/19/20 214 lb (97.1 kg)     GEN: Patient is in no acute distress HEENT: Normal NECK: No JVD; No carotid bruits LYMPHATICS: No lymphadenopathy CARDIAC: S1 S2 regular, 2/6 systolic murmur at  the apex. RESPIRATORY:  Clear to auscultation without rales, wheezing or rhonchi  ABDOMEN: Soft, non-tender, non-distended MUSCULOSKELETAL:  No edema; No deformity  SKIN: Warm and dry NEUROLOGIC:  Alert and oriented x 3 PSYCHIATRIC:  Normal affect    Signed, Nelia Balzarine, MD  10/24/2023 4:16 PM    [facility]

## 2023-10-29 ENCOUNTER — Other Ambulatory Visit: Payer: Self-pay | Admitting: Cardiology

## 2023-10-29 ENCOUNTER — Ambulatory Visit (HOSPITAL_BASED_OUTPATIENT_CLINIC_OR_DEPARTMENT_OTHER)
Admission: RE | Admit: 2023-10-29 | Discharge: 2023-10-29 | Disposition: A | Discharge status: Home / Self Care | Source: Ambulatory Visit | Attending: Cardiology | Admitting: Cardiology

## 2023-10-29 DIAGNOSIS — R0609 Other forms of dyspnea: Secondary | ICD-10-CM

## 2023-10-29 DIAGNOSIS — R011 Cardiac murmur, unspecified: Secondary | ICD-10-CM | POA: Diagnosis not present

## 2023-10-29 DIAGNOSIS — I10 Essential (primary) hypertension: Secondary | ICD-10-CM

## 2023-10-29 LAB — ECHOCARDIOGRAM COMPLETE
AR max vel: 2.86 cm2
AV Area VTI: 2.65 cm2
AV Area mean vel: 2.71 cm2
AV Mean grad: 3.5 mmHg
AV Peak grad: 5.9 mmHg
Ao pk vel: 1.21 m/s
Area-P 1/2: 3.68 cm2
Calc EF: 60.5 %
S' Lateral: 3 cm
Single Plane A2C EF: 62.5 %
Single Plane A4C EF: 59.5 %

## 2023-10-30 ENCOUNTER — Telehealth (HOSPITAL_COMMUNITY): Payer: Self-pay

## 2023-10-30 NOTE — Telephone Encounter (Signed)
 Spoke with the patient's daughter per her DPR, she stated she would have her here for test. S.Alyia Lacerte CCT

## 2023-10-31 ENCOUNTER — Ambulatory Visit: Payer: Self-pay | Admitting: Cardiology

## 2023-11-02 ENCOUNTER — Ambulatory Visit (HOSPITAL_COMMUNITY)
Admission: RE | Admit: 2023-11-02 | Discharge: 2023-11-02 | Disposition: A | Discharge status: Home / Self Care | Source: Ambulatory Visit | Attending: Cardiovascular Disease | Admitting: Cardiovascular Disease

## 2023-11-02 DIAGNOSIS — R011 Cardiac murmur, unspecified: Secondary | ICD-10-CM | POA: Insufficient documentation

## 2023-11-02 DIAGNOSIS — I10 Essential (primary) hypertension: Secondary | ICD-10-CM | POA: Diagnosis not present

## 2023-11-02 DIAGNOSIS — R0609 Other forms of dyspnea: Secondary | ICD-10-CM | POA: Diagnosis not present

## 2023-11-02 LAB — MYOCARDIAL PERFUSION IMAGING
LV dias vol: 108 mL (ref 46–106)
LV sys vol: 44 mL (ref 3.8–5.2)
Nuc Stress EF: 59 %
Peak HR: 76 {beats}/min
Rest HR: 54 {beats}/min
Rest Nuclear Isotope Dose: 9.9 mCi
SDS: 0
SRS: 0
SSS: 0
ST Depression (mm): 0 mm
Stress Nuclear Isotope Dose: 32.2 mCi
TID: 1.11

## 2023-11-02 MED ORDER — TECHNETIUM TC 99M TETROFOSMIN IV KIT
32.2000 | PACK | Freq: Once | INTRAVENOUS | Status: AC | PRN
  Administered 2023-11-02: 32.2 via INTRAVENOUS

## 2023-11-02 MED ORDER — REGADENOSON 0.4 MG/5ML IV SOLN
INTRAVENOUS | Status: AC
  Filled 2023-11-02: qty 5

## 2023-11-02 MED ORDER — REGADENOSON 0.4 MG/5ML IV SOLN
0.4000 mg | Freq: Once | INTRAVENOUS | Status: AC
  Administered 2023-11-02: 0.4 mg via INTRAVENOUS

## 2023-11-02 MED ORDER — TECHNETIUM TC 99M TETROFOSMIN IV KIT
9.9000 | PACK | Freq: Once | INTRAVENOUS | Status: AC | PRN
Start: 2023-11-02 — End: 2023-11-02
  Administered 2023-11-02: 9.9 via INTRAVENOUS

## 2023-11-05 ENCOUNTER — Telehealth: Payer: Self-pay | Admitting: Cardiology

## 2023-11-05 DIAGNOSIS — E039 Hypothyroidism, unspecified: Secondary | ICD-10-CM | POA: Diagnosis not present

## 2023-11-05 DIAGNOSIS — G629 Polyneuropathy, unspecified: Secondary | ICD-10-CM | POA: Diagnosis not present

## 2023-11-05 DIAGNOSIS — J302 Other seasonal allergic rhinitis: Secondary | ICD-10-CM | POA: Diagnosis not present

## 2023-11-05 DIAGNOSIS — R252 Cramp and spasm: Secondary | ICD-10-CM | POA: Diagnosis not present

## 2023-11-05 DIAGNOSIS — M8589 Other specified disorders of bone density and structure, multiple sites: Secondary | ICD-10-CM | POA: Diagnosis not present

## 2023-11-05 DIAGNOSIS — I509 Heart failure, unspecified: Secondary | ICD-10-CM | POA: Diagnosis not present

## 2023-11-05 DIAGNOSIS — I83893 Varicose veins of bilateral lower extremities with other complications: Secondary | ICD-10-CM | POA: Diagnosis not present

## 2023-11-05 DIAGNOSIS — I10 Essential (primary) hypertension: Secondary | ICD-10-CM | POA: Diagnosis not present

## 2023-11-05 NOTE — Telephone Encounter (Signed)
 Randine with [facility] radiology calling to report critical stress test.

## 2023-11-05 NOTE — Telephone Encounter (Signed)
 Kathleen Long called to report the following findings.  IMPRESSION: 1. No acute extra cardiac finding by noncontrast CT. 2. Cholelithiasis. 3. 2.1 cm nonspecific hypodense lesion in the left hepatic lobe, incompletely visualized. Recommend follow-up cross-sectional imaging with CT or MRI with contrast for further evaluation. 4. Aortic atherosclerosis.  Results have been sent to her PCP with abnormal findings.   Results reviewed with Kathleen Long. Kathleen Long verbalized understanding and had no additional questions.

## 2023-11-06 ENCOUNTER — Ambulatory Visit: Payer: Self-pay | Admitting: Cardiology

## 2023-11-07 NOTE — Telephone Encounter (Signed)
 Left detailed message on nurse line for PCP.

## 2023-11-08 ENCOUNTER — Ambulatory Visit: Payer: Self-pay

## 2023-12-03 DIAGNOSIS — E039 Hypothyroidism, unspecified: Secondary | ICD-10-CM | POA: Diagnosis not present

## 2023-12-03 DIAGNOSIS — I83893 Varicose veins of bilateral lower extremities with other complications: Secondary | ICD-10-CM | POA: Diagnosis not present

## 2023-12-03 DIAGNOSIS — G629 Polyneuropathy, unspecified: Secondary | ICD-10-CM | POA: Diagnosis not present

## 2023-12-03 DIAGNOSIS — I509 Heart failure, unspecified: Secondary | ICD-10-CM | POA: Diagnosis not present

## 2023-12-03 DIAGNOSIS — J302 Other seasonal allergic rhinitis: Secondary | ICD-10-CM | POA: Diagnosis not present

## 2023-12-03 DIAGNOSIS — K769 Liver disease, unspecified: Secondary | ICD-10-CM | POA: Diagnosis not present

## 2023-12-03 DIAGNOSIS — I10 Essential (primary) hypertension: Secondary | ICD-10-CM | POA: Diagnosis not present

## 2023-12-03 DIAGNOSIS — M8589 Other specified disorders of bone density and structure, multiple sites: Secondary | ICD-10-CM | POA: Diagnosis not present

## 2023-12-04 ENCOUNTER — Encounter: Payer: Self-pay | Admitting: Physician Assistant

## 2023-12-27 ENCOUNTER — Other Ambulatory Visit: Payer: Self-pay

## 2023-12-27 DIAGNOSIS — R600 Localized edema: Secondary | ICD-10-CM

## 2023-12-27 DIAGNOSIS — M79606 Pain in leg, unspecified: Secondary | ICD-10-CM

## 2024-02-05 ENCOUNTER — Ambulatory Visit (INDEPENDENT_AMBULATORY_CARE_PROVIDER_SITE_OTHER): Admitting: Physician Assistant

## 2024-02-05 ENCOUNTER — Ambulatory Visit (HOSPITAL_COMMUNITY)
Admission: RE | Admit: 2024-02-05 | Discharge: 2024-02-05 | Disposition: A | Discharge status: Home / Self Care | Source: Ambulatory Visit | Attending: Vascular Surgery | Admitting: Vascular Surgery

## 2024-02-05 VITALS — BP 123/77 | HR 73 | Temp 96.7°F | Resp 18 | Ht 62.0 in | Wt 216.9 lb

## 2024-02-05 DIAGNOSIS — M79604 Pain in right leg: Secondary | ICD-10-CM

## 2024-02-05 DIAGNOSIS — M7989 Other specified soft tissue disorders: Secondary | ICD-10-CM

## 2024-02-05 DIAGNOSIS — I872 Venous insufficiency (chronic) (peripheral): Secondary | ICD-10-CM | POA: Diagnosis not present

## 2024-02-05 DIAGNOSIS — I8393 Asymptomatic varicose veins of bilateral lower extremities: Secondary | ICD-10-CM

## 2024-02-05 DIAGNOSIS — M79606 Pain in leg, unspecified: Secondary | ICD-10-CM | POA: Insufficient documentation

## 2024-02-05 NOTE — Progress Notes (Unsigned)
 Requested by:  Rosalea Rosina SAILOR, PA [address] [place],  [place] [address]  Reason for consultation: Leg swelling   History of Present Illness   Kathleen Long is a 78 y.o. (1945-07-16) female who presents for evaluation of leg swelling.  Venous symptoms include: (aching, heavy, tired, throbbing, burning, itching, swelling, bleeding, ulcer)  *** Onset/duration:  ***  Occupation:  *** Aggravating factors: (sitting, standing) Alleviating factors: (elevation) Compression:  *** Helps:  *** Pain medications:  *** Previous vein procedures:  *** History of DVT:  *** Kathleen daughter is present at today's visit and provides most of the interpretation.  She states that Kathleen Long has had bilateral lower extremity varicose veins, left greater than right for several decades.  These do not cause Kathleen any pain or discomfort.  She says that Kathleen Long has developed progressive bilateral lower extremity swelling for the past couple of years.  Initially Kathleen swelling was mostly in Kathleen lower legs and ankles.  Over the past year or 2 she has noticed that Kathleen mom has been having significant swelling of Kathleen feet as well.  This does cause Kathleen legs to feel uncomfortable and crampy.  She notes that Kathleen swelling gets much worse later on the day.  She says that Kathleen mom walks around a lot throughout the day.  They have previously tried compression stockings, however they were too tight and she could not wear them for long.  She does not elevate Kathleen legs regularly.  She has no prior history of previous vein procedures.  She has no prior history of DVT.  Past Medical History:  Diagnosis Date   Acute respiratory failure due to COVID-19 [facility])    CHF (congestive heart failure) (HCC)    Essential hypertension    Hypertension    Hypothyroidism    Mild congestive heart failure (HCC)    Morbid obesity (HCC)    Neuropathy    OSA (obstructive sleep apnea) 06/15/2020   Pneumonia due to COVID-19 virus 01/19/2020    Seasonal allergies    Symptomatic varicose veins of both lower extremities    Thyroid  disease     Past Surgical History:  Procedure Laterality Date   CESAREAN SECTION  02/1986    Social History   Socioeconomic History   Marital status: Unknown    Spouse name: Not on file   Number of children: Not on file   Years of education: Not on file   Highest education level: Not on file  Occupational History   Not on file  Tobacco Use   Smoking status: Former    Types: Cigarettes   Smokeless tobacco: Never  Substance and Sexual Activity   Alcohol use: Never   Drug use: Never   Sexual activity: Not on file  Other Topics Concern   Not on file  Social History Narrative   Not on file   Social Drivers of Health   Financial Resource Strain: Not on file  Food Insecurity: Not on file  Transportation Needs: Not on file  Physical Activity: Not on file  Stress: Not on file  Social Connections: Not on file  Intimate Partner Violence: Not on file    Family History  Problem Relation Age of Onset   Cardiomyopathy Long    Cancer Sister     Current Outpatient Medications  Medication Sig Dispense Refill   amLODipine  (NORVASC ) 10 MG tablet Take 10 mg by mouth daily.     cetirizine (ZYRTEC) 10 MG tablet Take  10 mg by mouth daily.     Cholecalciferol (VITAMIN D3 PO) Take 1 tablet by mouth daily.     clotrimazole-betamethasone  (LOTRISONE) cream Apply 1 application topically 2 (two) times daily as needed (neck rash).      furosemide  (LASIX ) 20 MG tablet Take 20 mg by mouth daily.     gabapentin  (NEURONTIN ) 300 MG capsule Take 300 mg by mouth at bedtime.     hydrochlorothiazide (HYDRODIURIL) 12.5 MG tablet Take 12.5 mg by mouth daily.     ibuprofen (ADVIL) 200 MG tablet Take 400 mg by mouth 2 (two) times daily as needed for fever.     levothyroxine  (SYNTHROID ) 125 MCG tablet Take 125 mcg by mouth daily.     meloxicam (MOBIC) 7.5 MG tablet Take 7.5 mg by mouth daily.     No current  facility-administered medications for this visit.    No Known Allergies  REVIEW OF SYSTEMS (negative unless checked):   Cardiac:  []  Chest pain or chest pressure? []  Shortness of breath upon activity? []  Shortness of breath when lying flat? []  Irregular heart rhythm?  Vascular:  []  Pain in calf, thigh, or hip brought on by walking? []  Pain in feet at night that wakes you up from your sleep? []  Blood clot in your veins? [x]  Leg swelling?  Pulmonary:  []  Oxygen at home? []  Productive cough? []  Wheezing?  Neurologic:  []  Sudden weakness in arms or legs? []  Sudden numbness in arms or legs? []  Sudden onset of difficult speaking or slurred speech? []  Temporary loss of vision in one eye? []  Problems with dizziness?  Gastrointestinal:  []  Blood in stool? []  Vomited blood?  Genitourinary:  []  Burning when urinating? []  Blood in urine?  Psychiatric:  []  Major depression  Hematologic:  []  Bleeding problems? []  Problems with blood clotting?  Dermatologic:  []  Rashes or ulcers?  Constitutional:  []  Fever or chills?  Ear/Nose/Throat:  []  Change in hearing? []  Nose bleeds? []  Sore throat?  Musculoskeletal:  []  Back pain? []  Joint pain? []  Muscle pain?   Physical Examination     Vitals:   02/05/24 1254  BP: 123/77  Pulse: 73  Resp: 18  Temp: (!) 96.7 F (35.9 C)  TempSrc: Temporal  Weight: 216 lb 14.4 oz (98.4 kg)  Height: 5' 2 (1.575 m)   Body mass index is 39.67 kg/m.  General:  WDWN in NAD; vital signs documented above Gait: Not observed HENT: WNL, normocephalic Pulmonary: normal non-labored breathing  Cardiac: Regular Abdomen: soft, NT, no masses Skin: without rashes Vascular Exam/Pulses: 2+ DP pulses bilaterally Extremities: Bilateral calves with varicose veins, with reticular veins, with nonpitting lower leg and foot edema, without stasis pigmentation, without lipodermatosclerosis, without ulcers Musculoskeletal: no muscle wasting or  atrophy  Neurologic: A&O X 3;  No focal weakness or paresthesias are detected Psychiatric:  The pt has Normal affect.  Non-invasive Vascular Imaging   RLE Venous Insufficiency Duplex (02/05/2024):  +--------------+---------+------+-----------+------------+-------------+  RIGHT        Reflux NoRefluxReflux TimeDiameter cmsComments                               Yes                                        +--------------+---------+------+-----------+------------+-------------+  CFV  no                                                   +--------------+---------+------+-----------+------------+-------------+  FV mid        no                                                   +--------------+---------+------+-----------+------------+-------------+  Popliteal    no                                                   +--------------+---------+------+-----------+------------+-------------+  GSV at SFJ    no                            .81                    +--------------+---------+------+-----------+------------+-------------+  GSV prox thighno                            .52                    +--------------+---------+------+-----------+------------+-------------+  GSV mid thigh no                            .42                    +--------------+---------+------+-----------+------------+-------------+  GSV dist thighno                            .41                    +--------------+---------+------+-----------+------------+-------------+  GSV at knee   no                            .44                    +--------------+---------+------+-----------+------------+-------------+  GSV prox calf           yes    >500 ms      .34     out of fascia  +--------------+---------+------+-----------+------------+-------------+  GSV mid calf  no                            .18     out of fascia   +--------------+---------+------+-----------+------------+-------------+  GSV dist calf           yes    >500 ms      .23     out of fascia  +--------------+---------+------+-----------+------------+-------------+  [employer]    no                            .40                    +--------------+---------+------+-----------+------------+-------------+  SSV prox calf           yes    >500 ms      .28                    +--------------+---------+------+-----------+------------+-------------+  SSV mid calf  no                            .28                    +--------------+---------+------+-----------+------------+-------------+    Medical Decision Making   Kanasia Gayman is a 78 y.o. female who presents for evaluation of leg swelling  Based on the patient's duplex, there is reflux in the right greater saphenous vein at the proximal calf and distal calf.  There is also reflux in the small saphenous vein at the proximal calf.  The remainder of the patient's deep and superficial venous system is competent.  There is no evidence of DVT or SVT on exam.  She would not be a candidate for saphenous vein ablation given that it is competent throughout the thigh.    Sophiagrace Benbrook SHAUNNA Holster, PA-C [facility] Office: [phone]  02/05/2024, 1:03 PM  Clinic MD: Miguel

## 2024-02-14 DIAGNOSIS — Z809 Family history of malignant neoplasm, unspecified: Secondary | ICD-10-CM | POA: Diagnosis not present

## 2024-02-14 DIAGNOSIS — Z8249 Family history of ischemic heart disease and other diseases of the circulatory system: Secondary | ICD-10-CM | POA: Diagnosis not present

## 2024-02-14 DIAGNOSIS — E039 Hypothyroidism, unspecified: Secondary | ICD-10-CM | POA: Diagnosis not present

## 2024-02-14 DIAGNOSIS — J302 Other seasonal allergic rhinitis: Secondary | ICD-10-CM | POA: Diagnosis not present

## 2024-02-14 DIAGNOSIS — Z87891 Personal history of nicotine dependence: Secondary | ICD-10-CM | POA: Diagnosis not present

## 2024-02-14 DIAGNOSIS — I10 Essential (primary) hypertension: Secondary | ICD-10-CM | POA: Diagnosis not present

## 2024-02-14 DIAGNOSIS — Z87440 Personal history of urinary (tract) infections: Secondary | ICD-10-CM | POA: Diagnosis not present

## 2024-02-14 DIAGNOSIS — G629 Polyneuropathy, unspecified: Secondary | ICD-10-CM | POA: Diagnosis not present

## 2024-03-03 DIAGNOSIS — E039 Hypothyroidism, unspecified: Secondary | ICD-10-CM | POA: Diagnosis not present

## 2024-03-03 DIAGNOSIS — G629 Polyneuropathy, unspecified: Secondary | ICD-10-CM | POA: Diagnosis not present

## 2024-03-03 DIAGNOSIS — J302 Other seasonal allergic rhinitis: Secondary | ICD-10-CM | POA: Diagnosis not present

## 2024-03-03 DIAGNOSIS — R42 Dizziness and giddiness: Secondary | ICD-10-CM | POA: Diagnosis not present

## 2024-03-03 DIAGNOSIS — I10 Essential (primary) hypertension: Secondary | ICD-10-CM | POA: Diagnosis not present

## 2024-03-03 DIAGNOSIS — I509 Heart failure, unspecified: Secondary | ICD-10-CM | POA: Diagnosis not present

## 2024-03-03 DIAGNOSIS — R2689 Other abnormalities of gait and mobility: Secondary | ICD-10-CM | POA: Diagnosis not present

## 2024-03-03 DIAGNOSIS — I83893 Varicose veins of bilateral lower extremities with other complications: Secondary | ICD-10-CM | POA: Diagnosis not present

## 2024-03-17 DIAGNOSIS — E039 Hypothyroidism, unspecified: Secondary | ICD-10-CM | POA: Diagnosis not present

## 2024-03-17 DIAGNOSIS — R2689 Other abnormalities of gait and mobility: Secondary | ICD-10-CM | POA: Diagnosis not present

## 2024-03-17 DIAGNOSIS — I10 Essential (primary) hypertension: Secondary | ICD-10-CM | POA: Diagnosis not present

## 2024-03-17 DIAGNOSIS — I83893 Varicose veins of bilateral lower extremities with other complications: Secondary | ICD-10-CM | POA: Diagnosis not present

## 2024-03-17 DIAGNOSIS — I509 Heart failure, unspecified: Secondary | ICD-10-CM | POA: Diagnosis not present

## 2024-03-17 DIAGNOSIS — G629 Polyneuropathy, unspecified: Secondary | ICD-10-CM | POA: Diagnosis not present

## 2024-03-17 DIAGNOSIS — R42 Dizziness and giddiness: Secondary | ICD-10-CM | POA: Diagnosis not present

## 2024-03-17 DIAGNOSIS — J302 Other seasonal allergic rhinitis: Secondary | ICD-10-CM | POA: Diagnosis not present

## 2024-05-02 ENCOUNTER — Other Ambulatory Visit

## 2024-05-26 ENCOUNTER — Other Ambulatory Visit (HOSPITAL_BASED_OUTPATIENT_CLINIC_OR_DEPARTMENT_OTHER)

## 2024-07-15 ENCOUNTER — Ambulatory Visit: Admitting: Neurology
